# Patient Record
Sex: Female | Born: 1965 | Race: Black or African American | Hispanic: No | Marital: Married | Smoking: Former smoker
Health system: Southern US, Community
[De-identification: ages and names within clinical notes are randomized; demographics above are authoritative.]

## PROBLEM LIST (undated history)

## (undated) DIAGNOSIS — I10 Essential (primary) hypertension: Secondary | ICD-10-CM

## (undated) DIAGNOSIS — G43909 Migraine, unspecified, not intractable, without status migrainosus: Secondary | ICD-10-CM

## (undated) HISTORY — DX: Migraine, unspecified, not intractable, without status migrainosus: G43.909

## (undated) HISTORY — DX: Essential (primary) hypertension: I10

---

## 2008-09-23 ENCOUNTER — Telehealth (INDEPENDENT_AMBULATORY_CARE_PROVIDER_SITE_OTHER): Payer: Self-pay | Admitting: *Deleted

## 2008-09-23 ENCOUNTER — Encounter: Payer: Self-pay | Admitting: Family Medicine

## 2008-09-23 ENCOUNTER — Ambulatory Visit: Payer: Self-pay | Admitting: Family Medicine

## 2008-09-23 ENCOUNTER — Ambulatory Visit (HOSPITAL_COMMUNITY): Admission: RE | Admit: 2008-09-23 | Discharge: 2008-09-23 | Payer: Self-pay | Admitting: Family Medicine

## 2008-09-23 DIAGNOSIS — E669 Obesity, unspecified: Secondary | ICD-10-CM

## 2008-09-23 DIAGNOSIS — I10 Essential (primary) hypertension: Secondary | ICD-10-CM | POA: Insufficient documentation

## 2008-09-23 DIAGNOSIS — N920 Excessive and frequent menstruation with regular cycle: Secondary | ICD-10-CM

## 2008-09-23 DIAGNOSIS — I1A0 Resistant hypertension: Secondary | ICD-10-CM | POA: Insufficient documentation

## 2008-09-23 LAB — CONVERTED CEMR LAB
AST: 17 units/L (ref 0–37)
Albumin: 4.2 g/dL (ref 3.5–5.2)
BUN: 18 mg/dL (ref 6–23)
CO2: 23 meq/L (ref 19–32)
Calcium: 8.8 mg/dL (ref 8.4–10.5)
Chloride: 106 meq/L (ref 96–112)
Creatinine, Ser: 1.11 mg/dL (ref 0.40–1.20)
Direct LDL: 86 mg/dL
Glucose, Bld: 87 mg/dL (ref 70–99)
Potassium: 4.2 meq/L (ref 3.5–5.3)

## 2008-09-24 ENCOUNTER — Encounter: Payer: Self-pay | Admitting: Family Medicine

## 2008-09-25 ENCOUNTER — Encounter: Payer: Self-pay | Admitting: Family Medicine

## 2008-10-22 ENCOUNTER — Ambulatory Visit: Payer: Self-pay | Admitting: Family Medicine

## 2008-10-22 LAB — CONVERTED CEMR LAB
BUN: 16 mg/dL (ref 6–23)
Chloride: 106 meq/L (ref 96–112)
Potassium: 4 meq/L (ref 3.5–5.3)
Sodium: 138 meq/L (ref 135–145)

## 2008-10-26 ENCOUNTER — Telehealth: Payer: Self-pay | Admitting: *Deleted

## 2008-10-27 ENCOUNTER — Encounter: Payer: Self-pay | Admitting: Family Medicine

## 2009-03-23 ENCOUNTER — Emergency Department: Payer: Self-pay | Admitting: Emergency Medicine

## 2011-04-28 ENCOUNTER — Emergency Department: Payer: Self-pay | Admitting: Emergency Medicine

## 2011-04-28 LAB — CBC WITH DIFFERENTIAL/PLATELET
Basophil %: 0.2 %
Eosinophil %: 0.1 %
HCT: 37.1 % (ref 35.0–47.0)
HGB: 11.9 g/dL — ABNORMAL LOW (ref 12.0–16.0)
Lymphocyte %: 11 %
Monocyte %: 3.7 %
Neutrophil %: 85 %
RBC: 4.67 10*6/uL (ref 3.80–5.20)
WBC: 10.8 10*3/uL (ref 3.6–11.0)

## 2011-04-28 LAB — COMPREHENSIVE METABOLIC PANEL
BUN: 20 mg/dL — ABNORMAL HIGH (ref 7–18)
Calcium, Total: 8.9 mg/dL (ref 8.5–10.1)
Chloride: 104 mmol/L (ref 98–107)
Co2: 26 mmol/L (ref 21–32)
EGFR (African American): >60
EGFR (Non-African Amer.): 56 — ABNORMAL LOW
SGOT(AST): 20 U/L (ref 15–37)
SGPT (ALT): 17 U/L
Total Protein: 8.9 g/dL — ABNORMAL HIGH (ref 6.4–8.2)

## 2011-04-28 LAB — URINALYSIS, COMPLETE
Leukocyte Esterase: NEGATIVE
Nitrite: NEGATIVE
Ph: 8 (ref 4.5–8.0)
Specific Gravity: 1.008 (ref 1.003–1.030)
Squamous Epithelial: <1

## 2011-10-10 ENCOUNTER — Encounter: Payer: Self-pay | Admitting: Family Medicine

## 2011-10-10 ENCOUNTER — Ambulatory Visit (INDEPENDENT_AMBULATORY_CARE_PROVIDER_SITE_OTHER): Payer: 59 | Admitting: Family Medicine

## 2011-10-10 VITALS — BP 210/90 | Temp 98.6°F | Ht 65.0 in | Wt 221.9 lb

## 2011-10-10 DIAGNOSIS — I10 Essential (primary) hypertension: Secondary | ICD-10-CM

## 2011-10-10 LAB — BASIC METABOLIC PANEL
Chloride: 108 mEq/L (ref 96–112)
Glucose, Bld: 121 mg/dL — ABNORMAL HIGH (ref 70–99)
Potassium: 3.9 mEq/L (ref 3.5–5.3)
Sodium: 142 mEq/L (ref 135–145)

## 2011-10-10 MED ORDER — LISINOPRIL-HYDROCHLOROTHIAZIDE 20-12.5 MG PO TABS: 2.0000 | ORAL_TABLET | Freq: Every day | ORAL | Status: DC

## 2011-10-10 NOTE — Assessment & Plan Note (Signed)
Discussed the importance of taking blood pressure medications for now on. Dr. Leveda Anna also visited patient today - he expressed his concern about patient's a wide pulse pressure and long-standing, untreated hypertension. Will start lisinopril 20-HCTZ 12.5 milligrams daily today. Reviewed red flags of patient, signs of TIA or CVA that require prompt visit to emergency department. Patient patient states she will pick up blood pressure medication and start taking it today. She also agrees to schedule followup appointment with me next week. Will order BMET today.

## 2011-10-10 NOTE — Progress Notes (Signed)
  Subjective:    Anna Goodman is a 46 y.o. female who presents for evaluation of elevated blood pressures and to reestablish care.   Patient was last seen at [facility] about 2 years ago.  She used to take amlodipine and lisinopril/HCTZ for high blood pressure, however she has been out of medication for about 2 years now.  Patient denies any headache, blurry vision, fatigue, chest pain, shortness of breath. At times, she does endorse numbness/tingling sensation of bilateral upper extremities.  Patient denies any slurred speech, facial numbness, difficulty swallowing.    Otherwise, patient has no other complaints.  She wants to return for annual physical and Pap smear.  The following portions of the patient's history were reviewed and updated as appropriate: allergies, current medications, past family history, past medical history, past social history, past surgical history and problem list.  Review of Systems Pertinent items are noted in HPI.   Objective:    BP 210/90  Temp 98.6 F (37 C) (Oral)  Ht 5\' 5"  (1.651 m)  Wt 221 lb 14.4 oz (100.653 kg)  BMI 36.93 kg/m2  LMP 10/03/2011 General appearance: alert, cooperative and no distress Eyes: conjunctivae/corneas clear. PERRL, EOM's intact. Fundi benign. Lungs: clear to auscultation bilaterally Heart: regular rate and rhythm, S1, S2 normal, no murmur, click, rub or gallop Extremities: mild swelling bilateral feet, non-pitting, no redness or skin breakdown     Assessment:    Hypertension.  Today 210/90.  Plan:   See Problem List.

## 2011-10-10 NOTE — Patient Instructions (Signed)
It was nice to meet you today. Please pick up lisinopril-HCTZ at your pharmacy and start taking today. Try to avoid foods high in sodium and try to increase physical activity. If you develop severe headache, slurred speech, one-sided weakness, numbness or tingling of extremities, chest pain-please report to the ER. Schedule followup appointment with Dr. Domenick Bookbinder in one week.  Hypertension As your heart beats, it forces blood through your arteries. This force is your blood pressure. If the pressure is too high, it is called hypertension (HTN) or high blood pressure. HTN is dangerous because you may have it and not know it. High blood pressure may mean that your heart has to work harder to pump blood. Your arteries may be narrow or stiff. The extra work puts you at risk for heart disease, stroke, and other problems.  Blood pressure consists of two numbers, a higher number over a lower, 110/72, for example. It is stated as "110 over 72." The ideal is below 120 for the top number (systolic) and under 80 for the bottom (diastolic). Write down your blood pressure today. You should pay close attention to your blood pressure if you have certain conditions such as:  Heart failure.   Prior heart attack.   Diabetes   Chronic kidney disease.   Prior stroke.   Multiple risk factors for heart disease.  To see if you have HTN, your blood pressure should be measured while you are seated with your arm held at the level of the heart. It should be measured at least twice. A one-time elevated blood pressure reading (especially in the Emergency Department) does not mean that you need treatment. There may be conditions in which the blood pressure is different between your right and left arms. It is important to see your caregiver soon for a recheck. Most people have essential hypertension which means that there is not a specific cause. This type of high blood pressure may be lowered by changing lifestyle factors  such as:  Stress.   Smoking.   Lack of exercise.   Excessive weight.   Drug/tobacco/alcohol use.   Eating less salt.  Most people do not have symptoms from high blood pressure until it has caused damage to the body. Effective treatment can often prevent, delay or reduce that damage. TREATMENT  When a cause has been identified, treatment for high blood pressure is directed at the cause. There are a large number of medications to treat HTN. These fall into several categories, and your caregiver will help you select the medicines that are best for you. Medications may have side effects. You should review side effects with your caregiver. If your blood pressure stays high after you have made lifestyle changes or started on medicines,   Your medication(s) may need to be changed.   Other problems may need to be addressed.   Be certain you understand your prescriptions, and know how and when to take your medicine.   Be sure to follow up with your caregiver within the time frame advised (usually within two weeks) to have your blood pressure rechecked and to review your medications.   If you are taking more than one medicine to lower your blood pressure, make sure you know how and at what times they should be taken. Taking two medicines at the same time can result in blood pressure that is too low.  SEEK IMMEDIATE MEDICAL CARE IF:  You develop a severe headache, blurred or changing vision, or confusion.   You have  unusual weakness or numbness, or a faint feeling.   You have severe chest or abdominal pain, vomiting, or breathing problems.  MAKE SURE YOU:   Understand these instructions.   Will watch your condition.   Will get help right away if you are not doing well or get worse.  Document Released: 02/20/2005 Document Revised: 02/09/2011 Document Reviewed: 10/11/2007 [facility] Patient Information 2012 [place], [place].

## 2011-10-19 ENCOUNTER — Encounter: Payer: Self-pay | Admitting: Family Medicine

## 2011-10-19 ENCOUNTER — Other Ambulatory Visit (HOSPITAL_COMMUNITY)
Admission: RE | Admit: 2011-10-19 | Discharge: 2011-10-19 | Disposition: A | Discharge status: Home / Self Care | Payer: 59 | Source: Ambulatory Visit | Attending: Family Medicine | Admitting: Family Medicine

## 2011-10-19 ENCOUNTER — Ambulatory Visit (INDEPENDENT_AMBULATORY_CARE_PROVIDER_SITE_OTHER): Payer: 59 | Admitting: Family Medicine

## 2011-10-19 VITALS — BP 164/86 | Ht 65.0 in | Wt 223.0 lb

## 2011-10-19 DIAGNOSIS — Z124 Encounter for screening for malignant neoplasm of cervix: Secondary | ICD-10-CM

## 2011-10-19 DIAGNOSIS — I10 Essential (primary) hypertension: Secondary | ICD-10-CM

## 2011-10-19 DIAGNOSIS — Z01419 Encounter for gynecological examination (general) (routine) without abnormal findings: Secondary | ICD-10-CM | POA: Insufficient documentation

## 2011-10-19 DIAGNOSIS — L739 Follicular disorder, unspecified: Secondary | ICD-10-CM | POA: Insufficient documentation

## 2011-10-19 DIAGNOSIS — E669 Obesity, unspecified: Secondary | ICD-10-CM

## 2011-10-19 DIAGNOSIS — L738 Other specified follicular disorders: Secondary | ICD-10-CM

## 2011-10-19 MED ORDER — ADAPALENE 0.1 % EX LOTN: 1.0000 "application " | TOPICAL_LOTION | Freq: Every day | CUTANEOUS | Status: DC

## 2011-10-19 NOTE — Progress Notes (Signed)
  Subjective:     Anna Goodman is a 46 y.o. female and is here for a comprehensive physical exam. The patient is also here for follow up hypertension.  Patient was here one week ago, systolic blood pressure 210.  Patient restarted on blood pressure medications.  Denies any symptoms or side effects.  Patient is interested in weight loss.  She is asking for a medication to help suppress her appetite.  Patient exercises 2 times per week.  Typical diet and a day includes breakfast (boiled eggs, sausage, biscuit), lunch (fried chicken and bacteremia and cheese), dinner (hot dogs).  Patient says she drinks a lot of water and Gatorade, no soda.  Patient agrees that she needs to modify her lifestyle rather than take diet pills.  Patient here for Pap smear and referral for mammogram.  History   Social History  . Marital Status: Married    Spouse Name: N/A    Number of Children: N/A  . Years of Education: N/A   Occupational History  . Not on file.   Social History Main Topics  . Smoking status: Former Smoker    Types: Cigarettes  . Smokeless tobacco: Never Used  . Alcohol Use: Yes     drinks alcohol 2-4 times per month  . Drug Use: No  . Sexually Active:    Other Topics Concern  . Not on file   Social History Narrative  . No narrative on file   Health Maintenance  Topic Date Due  . Pap Smear  04/24/1983  . Tetanus/tdap  04/23/1984  . Influenza Vaccine  12/05/2011     Review of Systems Per history of present illness  Objective:    General appearance: alert, cooperative and no distress Head: Normocephalic, without obvious abnormality, atraumatic Throat: lips, mucosa, and tongue normal; teeth and gums normal Lungs: clear to auscultation bilaterally Breasts: normal appearance, no masses or tenderness Heart: regular rate and rhythm, S1, S2 normal, no murmur, click, rub or gallop Pelvic: cervix normal in appearance, external genitalia normal, no adnexal masses or tenderness, no  cervical motion tenderness, rectovaginal septum normal, uterus normal size, shape, and consistency and vagina normal without discharge Extremities: extremities normal, atraumatic, no cyanosis or edema  Skin: blackheads located on chin and bilateral inner thighs  Assessment:    Obesity, hypertension.     Plan:     See problem list. See After Visit Summary for Counseling Recommendations

## 2011-10-19 NOTE — Patient Instructions (Signed)
We will call or send letter with lab results in about one week. [facility] Call Dr. Gerilyn Pilgrim to schedule appointment to discuss Nutrition and weight loss. Schedule blood pressure follow up with me in 1-2 months. It was great to see you again today.

## 2011-10-19 NOTE — Assessment & Plan Note (Signed)
Differin gel lotion to decrease acne/folliculitis flare ups.

## 2011-10-19 NOTE — Assessment & Plan Note (Signed)
Blood pressure today much improved from last visit. Continue current regimen. Discussed lifestyle modification, weight loss, increase physical activity. Patient interested in meeting Dr. Gerilyn Pilgrim. Information given to schedule appointment. Recheck blood pressure in 1-2 months or sooner as needed.

## 2011-10-19 NOTE — Assessment & Plan Note (Signed)
See comments under essential hypertension.

## 2011-10-20 ENCOUNTER — Telehealth: Payer: Self-pay | Admitting: *Deleted

## 2011-10-20 NOTE — Telephone Encounter (Signed)
PA required for Differin lotion. Form placed in Dr. Bluford Kaufmann [place]'s box in Dr. Sherron Flemings Cruz's absence.

## 2011-10-23 NOTE — Telephone Encounter (Signed)
Approval received from insurance.  Pharmacy notified. 

## 2011-10-23 NOTE — Telephone Encounter (Signed)
Signed form and indicated for acne/recurrent folliculitis and placed in the "to be faxed" box

## 2011-10-25 ENCOUNTER — Encounter: Payer: Self-pay | Admitting: Family Medicine

## 2011-11-21 ENCOUNTER — Ambulatory Visit: Payer: 59 | Admitting: Family Medicine

## 2012-09-24 ENCOUNTER — Other Ambulatory Visit: Payer: Self-pay | Admitting: Family Medicine

## 2012-09-24 DIAGNOSIS — Z1231 Encounter for screening mammogram for malignant neoplasm of breast: Secondary | ICD-10-CM

## 2012-10-01 ENCOUNTER — Ambulatory Visit (HOSPITAL_COMMUNITY): Payer: 59

## 2012-10-03 ENCOUNTER — Ambulatory Visit (HOSPITAL_COMMUNITY)
Admission: RE | Admit: 2012-10-03 | Discharge: 2012-10-03 | Disposition: A | Discharge status: Home / Self Care | Payer: 59 | Source: Ambulatory Visit | Attending: Family Medicine | Admitting: Family Medicine

## 2012-10-03 DIAGNOSIS — Z1231 Encounter for screening mammogram for malignant neoplasm of breast: Secondary | ICD-10-CM | POA: Insufficient documentation

## 2012-10-08 ENCOUNTER — Emergency Department: Payer: Self-pay | Admitting: Emergency Medicine

## 2012-10-08 LAB — BASIC METABOLIC PANEL
Anion Gap: 6 — ABNORMAL LOW (ref 7–16)
BUN: 19 mg/dL — ABNORMAL HIGH (ref 7–18)
Co2: 26 mmol/L (ref 21–32)
Creatinine: 0.9 mg/dL (ref 0.60–1.30)
EGFR (Non-African Amer.): >60
Glucose: 89 mg/dL (ref 65–99)

## 2012-10-08 LAB — URINALYSIS, COMPLETE
Bilirubin,UR: NEGATIVE
Glucose,UR: NEGATIVE mg/dL (ref 0–75)
Nitrite: NEGATIVE
Ph: 5 (ref 4.5–8.0)
RBC,UR: 2 /HPF (ref 0–5)
Specific Gravity: 1.017 (ref 1.003–1.030)
Squamous Epithelial: 3
WBC UR: 160 /HPF (ref 0–5)

## 2012-10-08 LAB — CBC
MCH: 25.5 pg — ABNORMAL LOW (ref 26.0–34.0)
MCHC: 32.6 g/dL (ref 32.0–36.0)
RDW: 15.6 % — ABNORMAL HIGH (ref 11.5–14.5)
WBC: 8.8 10*3/uL (ref 3.6–11.0)

## 2013-01-02 ENCOUNTER — Telehealth: Payer: Self-pay | Admitting: Family Medicine

## 2013-01-02 MED ORDER — LISINOPRIL-HYDROCHLOROTHIAZIDE 20-12.5 MG PO TABS: 2.0000 | ORAL_TABLET | Freq: Every day | ORAL | Status: DC

## 2013-01-02 NOTE — Telephone Encounter (Signed)
Will fwd. To PCP for refill. Thanks. Lorenda Hatchet, Renato Battles

## 2013-01-02 NOTE — Telephone Encounter (Signed)
LMVM that refill sent.  Anivea Velasques, Darlyne Russian, CMA

## 2013-01-02 NOTE — Telephone Encounter (Signed)
Pt called and would like enough of her linsinopril sent to her pharmacy until her appointment which is 11/10. She is completely out. JW

## 2013-01-13 ENCOUNTER — Ambulatory Visit: Payer: 59 | Admitting: Family Medicine

## 2014-05-12 IMAGING — MG MM DIGITAL SCREENING BILAT W/ CAD
4 series · 4 of 4 positions shown · non-contrast
Comparison: None.

CLINICAL DATA: Screening.

DIGITAL SCREENING BILATERAL MAMMOGRAM WITH CAD

[R CC]
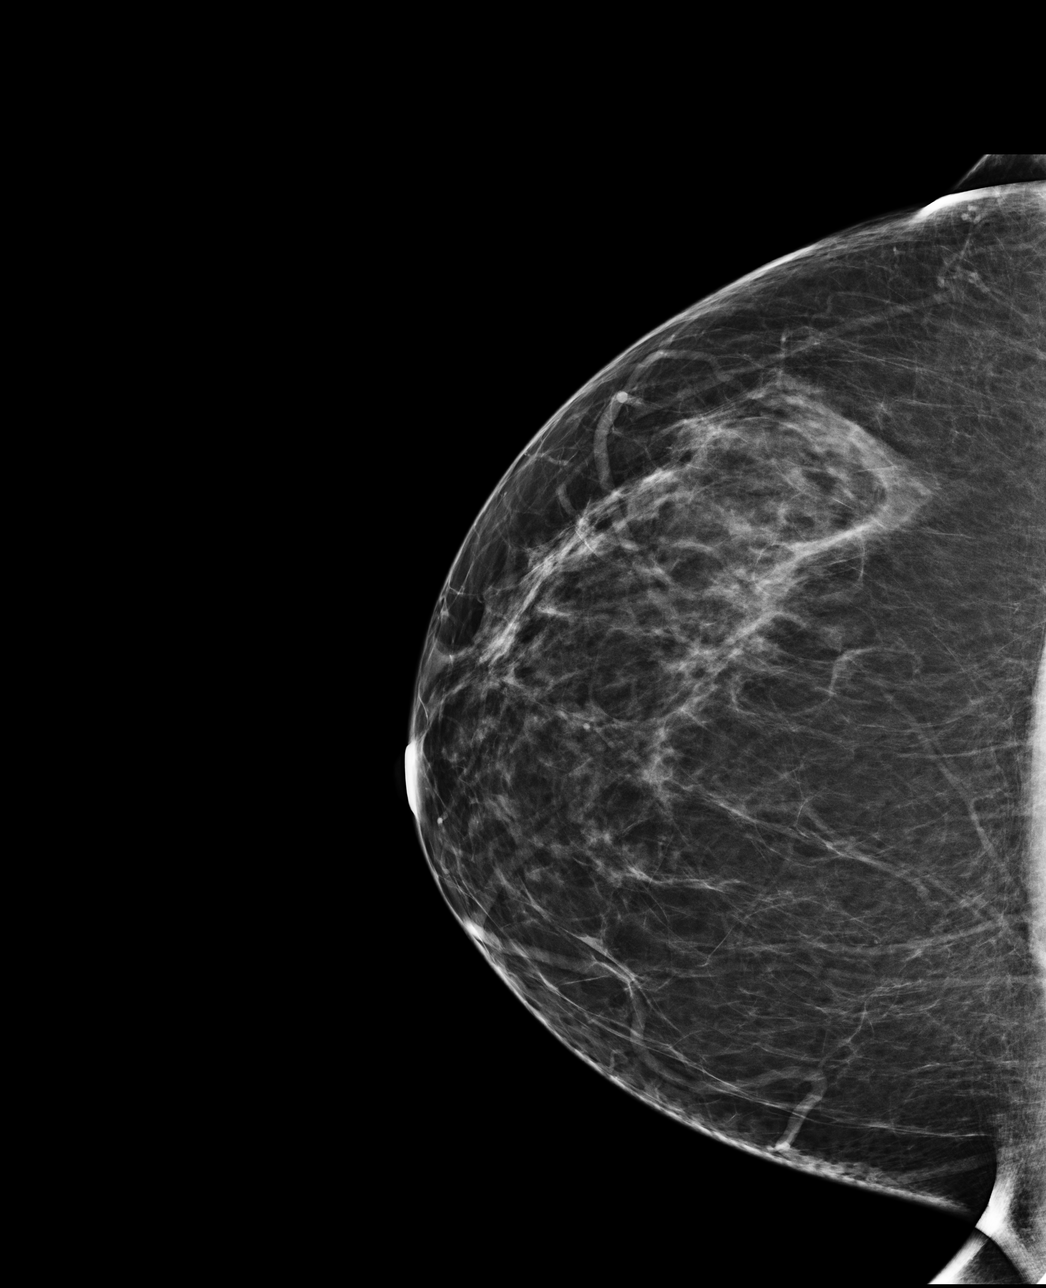

[L MLO]
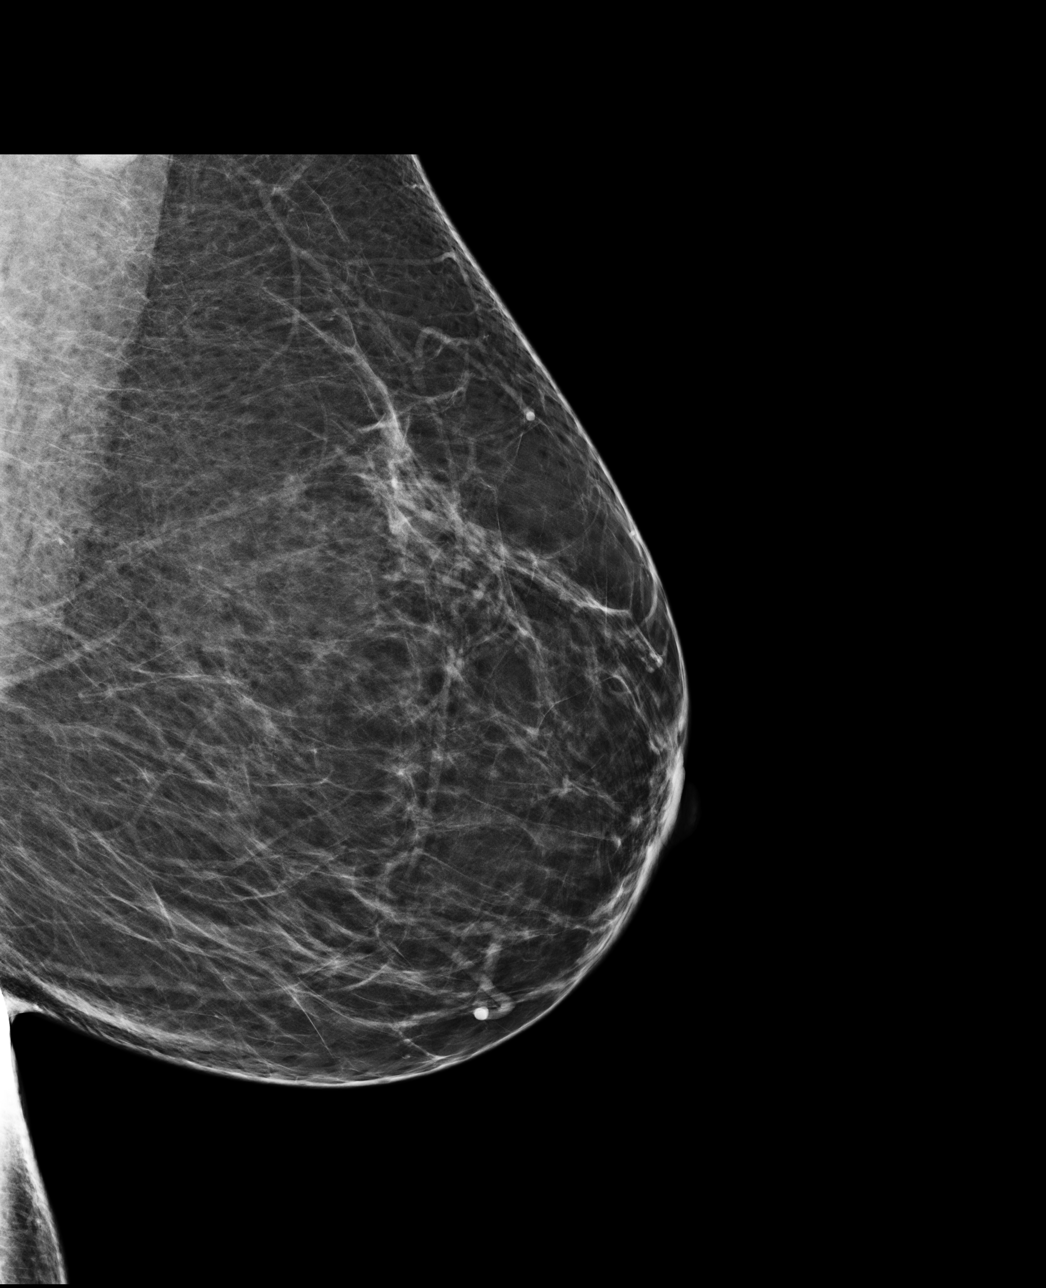

[L CC]
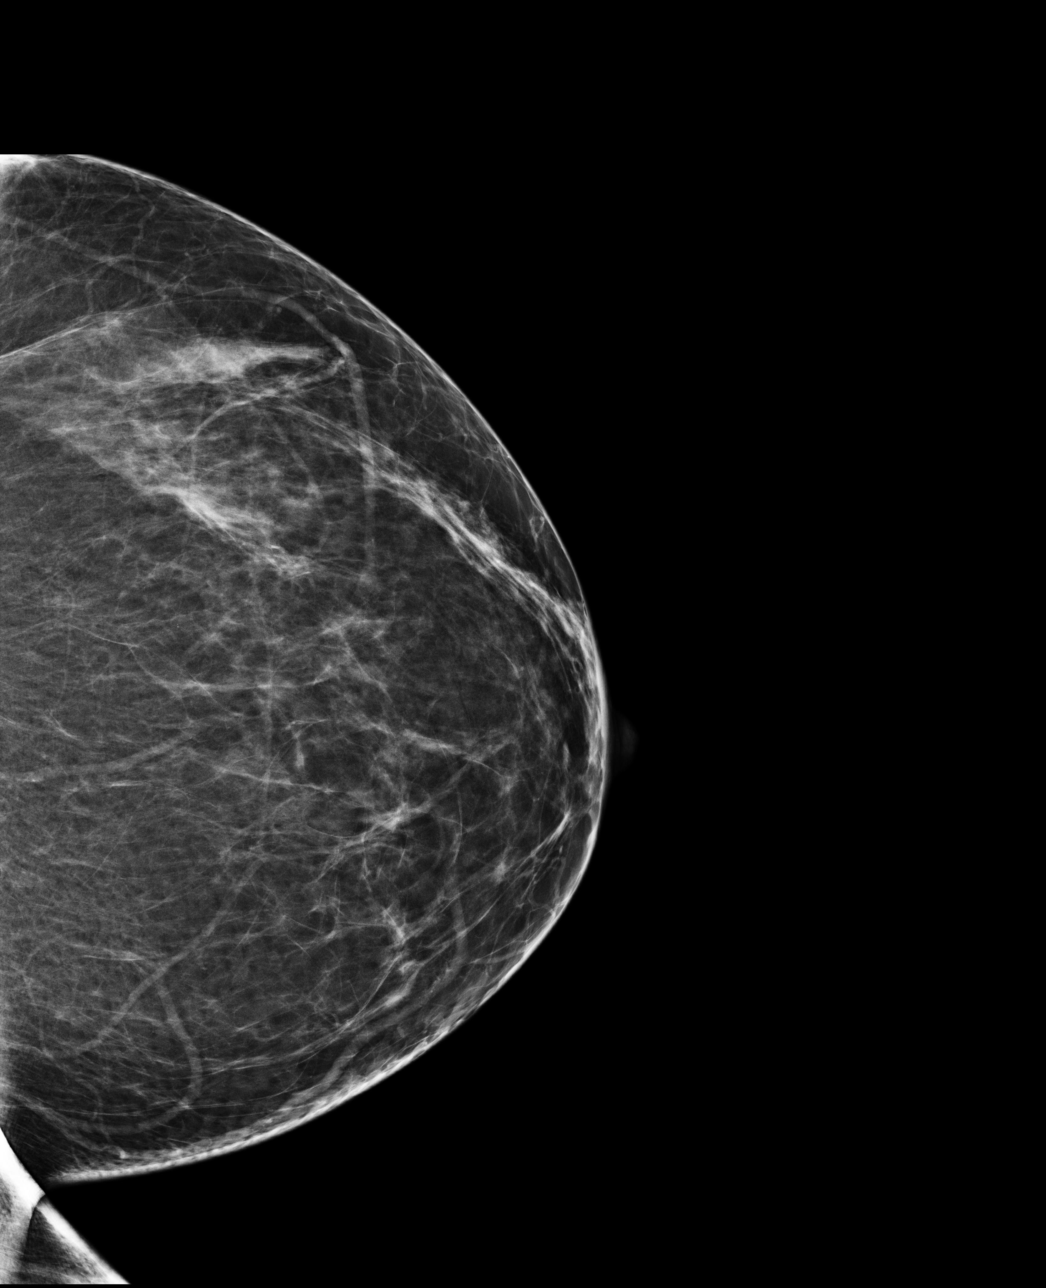

[R MLO]
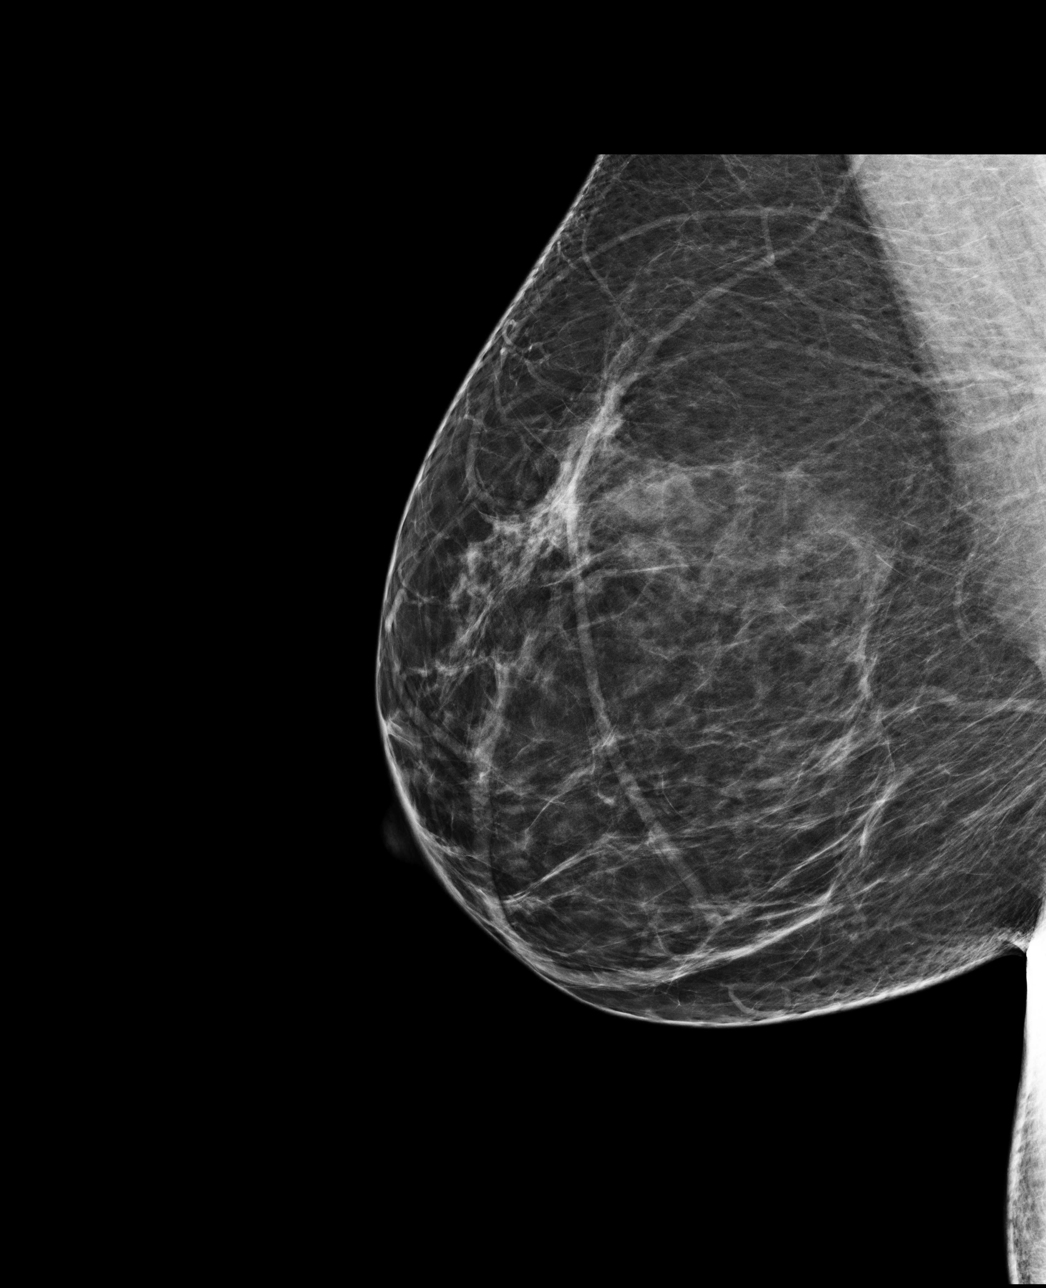

[4 of 4 positions shown; findings below may reference images not displayed]

FINDINGS: ACR Breast Density Category b:  There are scattered areas of
fibroglandular density.

There are no findings suspicious for malignancy.

Images were processed with CAD.
IMPRESSION: No mammographic evidence of malignancy.

A result letter of this screening mammogram will be mailed directly
to the patient.

RECOMMENDATION:
Screening mammogram in one year. (Code:WE-K-JAQ)

BI-RADS CATEGORY 1:  Negative.

## 2016-04-12 LAB — HM HIV SCREENING LAB: HM HIV Screening: NEGATIVE

## 2019-05-22 LAB — HM MAMMOGRAPHY

## 2021-05-10 ENCOUNTER — Encounter: Payer: Self-pay | Admitting: Family Medicine

## 2021-05-10 ENCOUNTER — Other Ambulatory Visit: Payer: Self-pay

## 2021-05-10 ENCOUNTER — Ambulatory Visit (INDEPENDENT_AMBULATORY_CARE_PROVIDER_SITE_OTHER): Payer: 59 | Admitting: Family Medicine

## 2021-05-10 VITALS — BP 186/110 | HR 83 | Ht 65.0 in | Wt 236.4 lb

## 2021-05-10 DIAGNOSIS — Z7689 Persons encountering health services in other specified circumstances: Secondary | ICD-10-CM

## 2021-05-10 DIAGNOSIS — G4733 Obstructive sleep apnea (adult) (pediatric): Secondary | ICD-10-CM | POA: Insufficient documentation

## 2021-05-10 DIAGNOSIS — I10 Essential (primary) hypertension: Secondary | ICD-10-CM

## 2021-05-10 DIAGNOSIS — E782 Mixed hyperlipidemia: Secondary | ICD-10-CM | POA: Diagnosis not present

## 2021-05-10 DIAGNOSIS — R29818 Other symptoms and signs involving the nervous system: Secondary | ICD-10-CM | POA: Diagnosis not present

## 2021-05-10 MED ORDER — ATORVASTATIN CALCIUM 10 MG PO TABS: 10.0000 mg | ORAL_TABLET | Freq: Every day | ORAL | 1 refills | Status: DC

## 2021-05-10 MED ORDER — AMLODIPINE-VALSARTAN-HCTZ 10-320-25 MG PO TABS: 1.0000 | ORAL_TABLET | Freq: Every day | ORAL | 1 refills | Status: DC

## 2021-05-10 NOTE — Assessment & Plan Note (Addendum)
Severely uncontrolled HTN initial, manual repeat improved but still elevated. She has known history of extremely elevated BP and is asymptomatic. ?Out of medications currently as main explanation ?Suspect underlying secondary HTN factors, Obesity, fam history ?- Home BP readings recomm  ? No known complications  ?  ? ?Plan:  ?1. DC Lisinopril 40 ?2. START combo pill triple therapy - Amlodipine 10mg  + Valsartan 320mg  daily + HCTZ 25mg  daily combo pill x 3 - patient prefers single pill. ?3. Encourage improved lifestyle - low sodium diet, regular exercise ?4. Continue monitor BP outside office, bring readings to next visit, if persistently >140/90 or new symptoms notify office sooner ? ?Next plan for OSA diagnosis and treatment first line for work up. ?If still uncontrolled, and unable to manage we can refer to Dr [facility] [place] for Adv HTN Clinic. ?

## 2021-05-10 NOTE — Progress Notes (Signed)
? ?Subjective:  ? ? Patient ID: Anna Goodman, female    DOB: 05-29-1965, 56 y.o.   MRN: [id number] ? ?Anna Goodman is a 56 y.o. female presenting on 05/10/2021 for Establish Care ? ?Establish care with PCP. [facility]. She is living in [place] and has relocated job in [employer]. ? ?HPI ? ?CHRONIC HTN: ?Reports history of HTN for years. Some difficulty with managing. She has been advised that weight is contributing. ? ?Currently off medication due to changing doctors and relocating, lately has had 180-220 / 100-125 while off medication ?However while on medication 140-180 / 80-90 ? ?Current Meds - Amlodipine 10mg  daily, Lisinopril-HCTZ 20-12.5mg  x 2 = 40-25mg  daily   ?Reports good compliance, DID NOT take meds today ?Lifestyle: ?- Diet: no sodium added. Limiting diet in take sodium. No coffee. Drinks tea occasional. Drinks mostly water. ?- Exercise: Limited activity ?- Admits some anxiety stress w/ husband's medical history ?Denies CP, dyspnea, HA, edema, dizziness / lightheadedness ? ?Obesity BMI >39 ? ?Suspected Sleep Apnea ? ?Epworth Sleepiness Scale ?Total Score: 14 ?Sitting and reading - 1 ?Watching TV - 3 ?Sitting inactive in a public place - 2 ?As a passenger in a car for an hour without a break - 3 ?Lying down to rest in the afternoon when circumstances permit - 3 ?Sitting and talking to someone - 0 ?Sitting quietly after a lunch without alcohol - 1 ?In a car, while stopped for a few minutes in traffic - 1 ? ?STOP-Bang OSA scoring ?Snoring yes   ?Tiredness yes   ?Observed apneas no   ?Pressure HTN yes   ?BMI > 35 kg/m2 yes   ?Age > 57  yes   ?Neck (female >17 in; Female >16 in)  yes   ?Gender female no   ?OSA risk low (0-2)  ?OSA risk intermediate (3-4)  ?OSA risk high (5+)  Total: 6 High risk  ? ? ? ?Depression screen [facility] 2/9 05/10/2021 10/19/2011  ?Decreased Interest 0 0  ?Down, Depressed, Hopeless 0 0  ?PHQ - 2 Score 0 0  ?Altered sleeping 0 -  ?Tired, decreased energy 0 -  ?Change in appetite 0 -   ?Feeling bad or failure about yourself  0 -  ?Trouble concentrating 0 -  ?Moving slowly or fidgety/restless 0 -  ?Suicidal thoughts 0 -  ?PHQ-9 Score 0 -  ?Difficult doing work/chores Not difficult at all -  ? ? ?Past Medical History:  ?Diagnosis Date  ? Hypertension   ? Migraine   ? ?Past Surgical History:  ?Procedure Laterality Date  ? [place]  ? [place]  ? ?Social History  ? ?Socioeconomic History  ? Marital status: Married  ?  Spouse name: Not on file  ? Number of children: Not on file  ? Years of education: Not on file  ? Highest education level: Not on file  ?Occupational History  ? Not on file  ?Tobacco Use  ? Smoking status: Former  ?  Types: Cigarettes  ? Smokeless tobacco: Never  ?Substance and Sexual Activity  ? Alcohol use: Yes  ?  Comment: drinks alcohol 2-4 times per month  ? Drug use: No  ? Sexual activity: Not on file  ?Other Topics Concern  ? Not on file  ?Social History Narrative  ? Not on file  ? ?Social Determinants of Health  ? ?Financial Resource Strain: Not on file  ?Food Insecurity: Not on file  ?Transportation Needs: Not on file  ?Physical  Activity: Not on file  ?Stress: Not on file  ?Social Connections: Not on file  ?Intimate Partner Violence: Not on file  ? ?Family History  ?Problem Relation Age of Onset  ? Cancer Mother   ? Depression Mother   ? Hypertension Mother   ? Hypertension Father   ? Hypertension Sister   ? Asthma Brother   ? ?Current Outpatient Medications on File Prior to Visit  ?Medication Sig  ? aspirin 81 MG EC tablet Take 1 tablet by mouth daily.  ? ?No current facility-administered medications on file prior to visit.  ? ? ?Review of Systems ?Per HPI unless specifically indicated above ? ? ?   ?Objective:  ?  ?BP (!) 186/110 (BP Location: Left Arm, Cuff Size: Normal)   Pulse 83   Ht 5\' 5"  (1.651 m)   Wt 236 lb 6.4 oz (107.2 kg)   SpO2 100%   BMI 39.34 kg/m?   ?Wt Readings from Last 3 Encounters:  ?05/10/21 236 lb 6.4 oz (107.2 kg)   ?10/19/11 223 lb (101.2 kg)  ?10/10/11 221 lb 14.4 oz (100.7 kg)  ?  ?Physical Exam ?Vitals and nursing note reviewed.  ?Constitutional:   ?   General: She is not in acute distress. ?   Appearance: Normal appearance. She is well-developed. She is obese. She is not diaphoretic.  ?   Comments: Well-appearing, comfortable, cooperative  ?HENT:  ?   Head: Normocephalic and atraumatic.  ?Eyes:  ?   General:     ?   Right eye: No discharge.     ?   Left eye: No discharge.  ?   Conjunctiva/sclera: Conjunctivae normal.  ?Cardiovascular:  ?   Rate and Rhythm: Normal rate.  ?Pulmonary:  ?   Effort: Pulmonary effort is normal.  ?Skin: ?   General: Skin is warm and dry.  ?   Findings: No erythema or rash.  ?Neurological:  ?   Mental Status: She is alert and oriented to person, place, and time.  ?Psychiatric:     ?   Mood and Affect: Mood normal.     ?   Behavior: Behavior normal.     ?   Thought Content: Thought content normal.  ?   Comments: Well groomed, good eye contact, normal speech and thoughts  ? ? ? ?Results for orders placed or performed in visit on 10/10/11  ?Basic Metabolic Panel  ?Result Value Ref Range  ? Sodium 142 135 - 145 mEq/L  ? Potassium 3.9 3.5 - 5.3 mEq/L  ? Chloride 108 96 - 112 mEq/L  ? CO2 24 19 - 32 mEq/L  ? Glucose, Bld 121 (H) 70 - 99 mg/dL  ? BUN 18 6 - 23 mg/dL  ? Creat 1.01 0.50 - 1.10 mg/dL  ? Calcium 9.2 8.4 - 10.5 mg/dL  ? ?   ?Assessment & Plan:  ? ?Problem List Items Addressed This Visit   ? ? Essential hypertension - Primary  ?  Severely uncontrolled HTN initial, manual repeat improved but still elevated. She has known history of extremely elevated BP and is asymptomatic. ?Out of medications currently as main explanation ?Suspect underlying secondary HTN factors, Obesity, fam history ?- Home BP readings recomm  ? No known complications  ?  ? ?Plan:  ?1. DC Lisinopril 40 ?2. START combo pill triple therapy - Amlodipine 10mg  + Valsartan 320mg  daily + HCTZ 25mg  daily combo pill x 3 - patient  prefers single pill. ?3. Encourage improved lifestyle - low sodium  diet, regular exercise ?4. Continue monitor BP outside office, bring readings to next visit, if persistently >140/90 or new symptoms notify office sooner ? ?Next plan for OSA diagnosis and treatment first line for work up. ?If still uncontrolled, and unable to manage we can refer to Dr Oval Linsey [facility] [place] for Adv HTN Clinic. ?  ?  ? Relevant Medications  ? aspirin 81 MG EC tablet  ? amLODIPine-Valsartan-HCTZ 10-320-25 MG TABS  ? atorvastatin (LIPITOR) 10 MG tablet  ? ?Other Visit Diagnoses   ? ? Encounter to establish care with new doctor      ? Mixed hyperlipidemia      ? Relevant Medications  ? aspirin 81 MG EC tablet  ? amLODIPine-Valsartan-HCTZ 10-320-25 MG TABS  ? atorvastatin (LIPITOR) 10 MG tablet  ? Suspected sleep apnea      ? ?  ?  ?Morbid Obesity BMI >39 ?Discussion today on weight management ?She will check into cost coverage w insurance on GLP1 and other options ?Encourage lifestyle management ?Future consider med manage or referral ? ?New problem with clinical concern for suspected obstructive sleep apnea given reported symptoms with apnea, snoring and sleep disturbance, excessive sleepiness. ?- Screening: ESS score 14 / STOP-Bang Score 6 ?- Neck Circumference: 16.5" ?- Co-morbidities: HTN ? ?Plan: ?1. Discussion on initial diagnosis and testing for OSA, risk factors, management, complications ?2. Agree to proceed with sleep study testing based on clinical concerns - will fax referral to [facility] - to arrange initial PSG home vs in sleep lab and further evaluation. ? ? ?Meds ordered this encounter  ?Medications  ? amLODIPine-Valsartan-HCTZ 10-320-25 MG TABS  ?  Sig: Take 1 tablet by mouth daily.  ?  Dispense:  90 tablet  ?  Refill:  1  ? atorvastatin (LIPITOR) 10 MG tablet  ?  Sig: Take 1 tablet (10 mg total) by mouth daily.  ?  Dispense:  90 tablet  ?  Refill:  1  ? ? ? ? ?Follow up plan: ?Return in about 3  weeks (around 05/31/2021) for 3 weeks fasting lab only then 1 week later Annual Physical. ? ?Nobie Putnam, DO ?[facility] ?[facility] ?05/10/2021, 3:31 PM ?

## 2021-05-10 NOTE — Patient Instructions (Addendum)
Thank you for coming to the office today. ? ?New blood pressure pill - has all 3 meds in 1 pill - new one instead of Lisinopril = Valsartan 320mg  ? ?[facility] ?Address: [address] [place], [place], [place] ?Phone: [phone] ? ?Referral sent via fax today. Stay tuned to hear back from them, they should call you to schedule initial sleep study, likely a home test and then if abnormal - they will arrange the 2nd part of the sleep study in the lab with the CPAP machine. ? ?If there is any issue with this company, for example not covered by insurance or other problem, please notify [phone] and they may do so a well - we will need to change the location of the referral. ? ? ?Sleep Hygiene ?Recommendations to promote healthy sleep in all patients, especially if symptoms of insomnia are worsening. Due to the nature of sleep rhythms, if your body gets "out of rhythm", it may take some time before your sleep cycle can be "reset". ? ?Please try to follow as many of the following tips as you can, usually there are only a few of these are the primary cause of the problem. ? ??To reset your sleep rhythm, go to bed and get up at the same time every day ??Sleep only long enough to feel rested and then get out of bed ??Do not try to force yourself to sleep. If you can't sleep, get out of bed and try again later. ??Avoid naps during the day, unless excessively tired. The more sleeping during the day, then the less sleep your body needs at night. ? ??Have coffee, tea, and other foods that have caffeine only in the morning ??Exercise several days a week, but not right before bed ??If you drink alcohol, prefer to have appropriate drink with one meal, but prefer to avoid alcohol in the evening, and bedtime ??If you smoke, avoid smoking, especially in the evening ? ??Avoid watching TV or looking at phones, computers, or reading devices ("e-books") that give off light at least 30 minutes before bed. This  artificial light sends "awake signals" to your brain and can make it harder to fall asleep. ??Make your bedroom a comfortable place where it is easy to fall asleep: ?Put up shades or special blackout curtains to block light from outside. ?Use a white noise machine to block noise. ?Keep the temperature cool. ??Try your best to solve or at least address your problems before you go to bed ??Use relaxation techniques to manage stress. Ask your health care provider to suggest some techniques that may work well for you. These may include: ?Breathing exercises. ?Routines to release muscle tension. ?Visualizing peaceful scenes. ? ?--------------------------------------- ? ?Call insurance find cost and coverage of the following - check the following: ?- Drug Tier, Preferred List, On Formulary ?- All will require a "Prior Authorization" from [place] first, before you can find out the cost ?- Find out if there is "Step Therapy" (other medicines required before you can try these) ? ?Once you pick the one you want to try, let me know - we can get a sample ready IF we have it in stock. Then try it - and before running out of medicine, contact me back to order your Rx so we have time to get it processed. ? ?For Pre-Diabetes / Diabetes ?  ?1. Ozempic (Semaglutide injection) - start 0.25mg  weekly for 4 weeks then increase to 0.5mg  weekly, sample is 6 doses, re-use  the same pen until empty, new needle each dose. ?  ?2. Trulicity (Dulaglutide) - once weekly 0.75 (likely we would start) and 1.5 max dose, sample is 2 doses, 1 dose per pen, each pen is a one time use, no visible needle, it is an auto-injector. ? ?---------- ? ?For Weight Loss / Obesity only ? ?Wegovy (same as Ozempic) weekly injection - start 0.25mg  weekly, 1 dose per pen, single use, auto-injector ? ?2. Saxenda - DAILY injection - start 0.6mg  injection DAILY, sample is 3 weeks, new needle each dose. ? ? ?3 benefits ?- 1 significantly reduced A1c sugar, and may be able to  reduce or stop metformin in future ?- 2 reduced appetite and weight loss with good results ?- 3 cardiovascular risk reduction, less likely to have heart attack/stroke ? ? ?--------------------------------------------- ? ? ?DUE for FASTING BLOOD WORK (no food or drink after midnight before the lab appointment, only water or coffee without cream/sugar on the morning of) ? ?SCHEDULE "Lab Only" visit in the morning at the clinic for lab draw in 4 weeks ? ?- Make sure Lab Only appointment is at about 1 week before your next appointment, so that results will be available ? ?For Lab Results, once available within 2-3 days of blood draw, you can can log in to MyChart online to view your results and a brief explanation. Also, we can discuss results at next follow-up visit. ? ? ?Please schedule a Follow-up Appointment to: Return in about 3 weeks (around 05/31/2021) for 3 weeks fasting lab only then 1 week later Annual Physical. ? ?If you have any other questions or concerns, please feel free to call the office or send a message through MyChart. You may also schedule an earlier appointment if necessary. ? ?Additionally, you may be receiving a survey about your experience at our office within a few days to 1 week by e-mail or mail. We value your feedback. ? ?Saralyn Pilar, DO ?[facility], [place] ?

## 2021-06-01 ENCOUNTER — Telehealth: Payer: Self-pay | Admitting: Pharmacist

## 2021-06-01 ENCOUNTER — Encounter: Payer: Self-pay | Admitting: Family Medicine

## 2021-06-01 NOTE — Telephone Encounter (Signed)
? ? ?  Patient appearing on report for [place] Metric Hypertension Control due to last documented ambulatory blood pressure of 186/110 on 05/10/2021. Next appointment with PCP is 06/10/2021  ? ?Outreached patient to discuss hypertension control and medication management. Was unable to reach patient and unable to leave a message as voicemail box is full. ? ?Will attempt to reach patient by telephone again within the next 30 days. ? ?Wallace Cullens, PharmD, BCACP, CPP ?Clinical Pharmacist ?[facility] ?[place] ?[phone] ? ? ?

## 2021-06-01 NOTE — Telephone Encounter (Signed)
? ?  Outreach Note ? ?06/01/2021 ?Name: Anna Goodman MRN: [id number] DOB: December 31, 1965 ? ?Patient appearing on report for [place] Metric Hypertension Control due to last documented ambulatory pressure of 186/110 on 05/10/2021. Next appointment with PCP is 06/10/2021 (lab work on 06/03/2021). ? ?Outreached patient to discuss hypertension control and medication management.  ? ?Outpatient Encounter Medications as of 06/01/2021  ?Medication Sig  ? amLODIPine-Valsartan-HCTZ 10-320-25 MG TABS Take 1 tablet by mouth daily.  ? aspirin 81 MG EC tablet Take 1 tablet by mouth daily.  ? atorvastatin (LIPITOR) 10 MG tablet Take 1 tablet (10 mg total) by mouth daily.  ? ?No facility-administered encounter medications on file as of 06/01/2021.  ? ? ?Current medications: amlodipine-valsartan-HCTZ 10-320-25 mg daily ?Reports missed once over past 2 weeks ? ?Patient does not have an automated upper arm home BP machine. ?Patient currently has wrist monitor. She does not have her readings with her at the time of our call, but recalls recent systolic readings ranging 180s-200, even since taking amlodipine-valsartan-HCTZ 10-320-25 mg daily ? ? ?Patient denies recent hypertensive symptoms including chest pain, shortness of breath, back pain, numbness/weakness, change in vision, or difficulty speaking with recent readings ? ?Current physical activity: reports limited recently ? ?Does not add salt to her food, but admits to recently eating higher sodium food recently as refrigerator currently broken (eating out more) ? ? ?Assessment/Plan: ?Currently significantly uncontrolled ?Reviewed appropriate administration of medication regimen ?Counseled on long term microvascular and macrovascular complications of uncontrolled hypertension ?Discussed dietary modifications, such as reduced salt intake, focus on whole grains, vegetables, lean proteins ?Discussed goal of 150 minutes of moderate intensity physical activity weekly - Collaborated with primary care  provider office staff to ensure patient has scheduled follow up ?Reviewed appropriate home BP monitoring technique (avoid caffeine, smoking, and exercise for 30 minutes before checking, rest for at least 5 minutes before taking BP, sit with feet flat on the floor and back against a hard surface, uncross legs, and rest arm on flat surface) ?Will email blood pressure log and handout on monitoring technique to patient ?Patient will obtain home upper arm blood pressure monitor ?Reviewed to check blood pressure, document, and provide at next provider visit ?Advised patient if blood pressure reading is 180/120 or greater and experiencing any symptoms of headache, chest pain, shortness of breath, back pain, numbness/weakness, change in vision, or difficulty speaking to seek emergency care ?Will collaborate with PCP today regarding report of significantly elevated home blood pressure ? ?Follow Up Plan: CM Pharmacist will follow up with patient by telephone within the next 14 days ? ?Estelle Grumbles, PharmD, BCACP ?Clinical Pharmacist ?[facility] ?[phone] ? ? ? ? ?SIG ? ?

## 2021-06-01 NOTE — Telephone Encounter (Addendum)
Pt returning call. ?Pt requesting a call back.  ? ?[phone]  ?

## 2021-06-02 ENCOUNTER — Other Ambulatory Visit: Payer: Self-pay

## 2021-06-02 DIAGNOSIS — Z0000 Encounter for general adult medical examination without abnormal findings: Secondary | ICD-10-CM

## 2021-06-03 ENCOUNTER — Other Ambulatory Visit: Payer: 59

## 2021-06-04 LAB — COMPREHENSIVE METABOLIC PANEL
AG Ratio: 1.4 (calc) (ref 1.0–2.5)
ALT: 13 U/L (ref 6–29)
AST: 15 U/L (ref 10–35)
Albumin: 4.3 g/dL (ref 3.6–5.1)
Alkaline phosphatase (APISO): 34 U/L — ABNORMAL LOW (ref 37–153)
BUN/Creatinine Ratio: 25 (calc) — ABNORMAL HIGH (ref 6–22)
BUN: 30 mg/dL — ABNORMAL HIGH (ref 7–25)
CO2: 26 mmol/L (ref 20–32)
Calcium: 9.8 mg/dL (ref 8.6–10.4)
Chloride: 102 mmol/L (ref 98–110)
Creat: 1.2 mg/dL — ABNORMAL HIGH (ref 0.50–1.03)
Globulin: 3.1 g/dL (calc) (ref 1.9–3.7)
Glucose, Bld: 95 mg/dL (ref 65–99)
Potassium: 4.3 mmol/L (ref 3.5–5.3)
Sodium: 139 mmol/L (ref 135–146)
Total Bilirubin: 0.4 mg/dL (ref 0.2–1.2)
Total Protein: 7.4 g/dL (ref 6.1–8.1)

## 2021-06-04 LAB — CBC WITH DIFFERENTIAL/PLATELET
Absolute Monocytes: 320 cells/uL (ref 200–950)
Basophils Absolute: 19 cells/uL (ref 0–200)
Basophils Relative: 0.4 %
Eosinophils Absolute: 42 cells/uL (ref 15–500)
Eosinophils Relative: 0.9 %
HCT: 44.2 % (ref 35.0–45.0)
Hemoglobin: 13.8 g/dL (ref 11.7–15.5)
Lymphs Abs: 1575 cells/uL (ref 850–3900)
MCH: 27 pg (ref 27.0–33.0)
MCHC: 31.2 g/dL — ABNORMAL LOW (ref 32.0–36.0)
MCV: 86.3 fL (ref 80.0–100.0)
MPV: 13.3 fL — ABNORMAL HIGH (ref 7.5–12.5)
Monocytes Relative: 6.8 %
Neutro Abs: 2745 cells/uL (ref 1500–7800)
Neutrophils Relative %: 58.4 %
Platelets: 181 10*3/uL (ref 140–400)
RBC: 5.12 10*6/uL — ABNORMAL HIGH (ref 3.80–5.10)
RDW: 13.6 % (ref 11.0–15.0)
Total Lymphocyte: 33.5 %
WBC: 4.7 10*3/uL (ref 3.8–10.8)

## 2021-06-04 LAB — TSH: TSH: 4.57 mIU/L — ABNORMAL HIGH (ref 0.40–4.50)

## 2021-06-04 LAB — LIPID PANEL
Cholesterol: 251 mg/dL — ABNORMAL HIGH (ref <200)
HDL: 65 mg/dL (ref >=50)
LDL Cholesterol (Calc): 149 mg/dL (calc) — ABNORMAL HIGH
Non-HDL Cholesterol (Calc): 186 mg/dL (calc) — ABNORMAL HIGH (ref <130)
Total CHOL/HDL Ratio: 3.9 (calc) (ref <5.0)
Triglycerides: 229 mg/dL — ABNORMAL HIGH (ref <150)

## 2021-06-10 ENCOUNTER — Encounter: Payer: Self-pay | Admitting: Family Medicine

## 2021-06-10 ENCOUNTER — Ambulatory Visit (INDEPENDENT_AMBULATORY_CARE_PROVIDER_SITE_OTHER): Payer: 59 | Admitting: Family Medicine

## 2021-06-10 VITALS — BP 156/98 | HR 69 | Ht 65.0 in | Wt 236.2 lb

## 2021-06-10 DIAGNOSIS — Z1231 Encounter for screening mammogram for malignant neoplasm of breast: Secondary | ICD-10-CM

## 2021-06-10 DIAGNOSIS — Z124 Encounter for screening for malignant neoplasm of cervix: Secondary | ICD-10-CM

## 2021-06-10 DIAGNOSIS — G4733 Obstructive sleep apnea (adult) (pediatric): Secondary | ICD-10-CM | POA: Diagnosis not present

## 2021-06-10 DIAGNOSIS — Z0000 Encounter for general adult medical examination without abnormal findings: Secondary | ICD-10-CM

## 2021-06-10 DIAGNOSIS — I1A0 Resistant hypertension: Secondary | ICD-10-CM

## 2021-06-10 DIAGNOSIS — I10 Essential (primary) hypertension: Secondary | ICD-10-CM

## 2021-06-10 NOTE — Progress Notes (Signed)
? ?Subjective:  ? ? Patient ID: Anna Goodman, female    DOB: 1965/05/17, 56 y.o.   MRN: [id number] ? ?Anna Goodman is a 56 y.o. female presenting on 06/10/2021 for Annual Exam ? ? ?HPI ? ?Here for Annual Physical and Lab Review. ? ?CHRONIC HTN: ? ?Home BP readings 188/96, 170/97, 204/104, 198/106, 170/90, 216/103 ? ?Reports history of HTN for years. Some difficulty with managing. She has been advised that weight is contributing. ?   ?Current Meds - Amlodipine-Valsartan-HCTZ 10-320-25mg  daily ?Did take meds today ?Lifestyle: ?- Diet: no sodium added. Limiting diet in take sodium. No coffee. Drinks tea occasional. Drinks mostly water. ?- Exercise: Limited activity ?- Admits some anxiety stress w/ husband's medical history ?Denies CP, dyspnea, HA, edema, dizziness / lightheadedness ? ?HYPERLIPIDEMIA: ?- Reports no concerns. Last lipid panel 06/2021, elevated LDL 149 ?- Currently taking Atorvastatin 10mg , tolerating well without side effects or myalgias ? ? ?Elevated TSH ?Very Mild elevated TSH 4.57, no prior history of this. ?  ?Obesity BMI >39 ?  ?OSA ?Confirmed on recent home PSG test. However she is unable to pursue CPAP Titration and CPAP machine due to cost at this time. ? ? ?Health Maintenance: ? ?Last Mammogram 05/22/19. Due for repeat. ? ?Due for pap will need referral ? ? ? ?  06/10/2021  ?  1:46 PM 05/10/2021  ?  3:25 PM 10/19/2011  ?  4:13 PM  ?Depression screen PHQ 2/9  ?Decreased Interest 0 0 0  ?Down, Depressed, Hopeless 0 0 0  ?PHQ - 2 Score 0 0 0  ?Altered sleeping 0 0   ?Tired, decreased energy 1 0   ?Change in appetite 2 0   ?Feeling bad or failure about yourself  0 0   ?Trouble concentrating 0 0   ?Moving slowly or fidgety/restless 0 0   ?Suicidal thoughts 0 0   ?PHQ-9 Score 3 0   ?Difficult doing work/chores Not difficult at all Not difficult at all   ? ? ?Past Medical History:  ?Diagnosis Date  ? Hypertension   ? Migraine   ? ?Past Surgical History:  ?Procedure Laterality Date  ? [place]  ?  [place]  ? ?Social History  ? ?Socioeconomic History  ? Marital status: Married  ?  Spouse name: Not on file  ? Number of children: Not on file  ? Years of education: Not on file  ? Highest education level: Not on file  ?Occupational History  ? Not on file  ?Tobacco Use  ? Smoking status: Former  ?  Types: Cigarettes  ? Smokeless tobacco: Never  ?Substance and Sexual Activity  ? Alcohol use: Yes  ?  Comment: drinks alcohol 2-4 times per month  ? Drug use: No  ? Sexual activity: Not on file  ?Other Topics Concern  ? Not on file  ?Social History Narrative  ? Not on file  ? ?Social Determinants of Health  ? ?Financial Resource Strain: Not on file  ?Food Insecurity: Not on file  ?Transportation Needs: Not on file  ?Physical Activity: Not on file  ?Stress: Not on file  ?Social Connections: Not on file  ?Intimate Partner Violence: Not on file  ? ?Family History  ?Problem Relation Age of Onset  ? Cancer Mother   ? Depression Mother   ? Hypertension Mother   ? Hypertension Father   ? Hypertension Sister   ? Asthma Brother   ? ?Current Outpatient Medications on File Prior to Visit  ?  Medication Sig  ? amLODIPine-Valsartan-HCTZ 10-320-25 MG TABS Take 1 tablet by mouth daily.  ? aspirin 81 MG EC tablet Take 1 tablet by mouth daily.  ? atorvastatin (LIPITOR) 10 MG tablet Take 1 tablet (10 mg total) by mouth daily.  ? ?No current facility-administered medications on file prior to visit.  ? ? ?Review of Systems  ?Constitutional:  Negative for activity change, appetite change, chills, diaphoresis, fatigue and fever.  ?HENT:  Negative for congestion and hearing loss.   ?Eyes:  Negative for visual disturbance.  ?Respiratory:  Negative for cough, chest tightness, shortness of breath and wheezing.   ?Cardiovascular:  Negative for chest pain, palpitations and leg swelling.  ?Gastrointestinal:  Negative for abdominal pain, constipation, diarrhea, nausea and vomiting.  ?Genitourinary:  Negative for dysuria, frequency and  hematuria.  ?Musculoskeletal:  Negative for arthralgias and neck pain.  ?Skin:  Negative for rash.  ?Neurological:  Negative for dizziness, weakness, light-headedness, numbness and headaches.  ?Hematological:  Negative for adenopathy.  ?Psychiatric/Behavioral:  Negative for behavioral problems, dysphoric mood and sleep disturbance.   ?Per HPI unless specifically indicated above ? ? ?   ?Objective:  ?  ?BP (!) 156/98   Pulse 69   Ht 5\' 5"  (1.651 m)   Wt 236 lb 3.2 oz (107.1 kg)   SpO2 100%   BMI 39.31 kg/m?   ?Wt Readings from Last 3 Encounters:  ?06/10/21 236 lb 3.2 oz (107.1 kg)  ?05/10/21 236 lb 6.4 oz (107.2 kg)  ?10/19/11 223 lb (101.2 kg)  ?  ?Physical Exam ?Vitals and nursing note reviewed.  ?Constitutional:   ?   General: She is not in acute distress. ?   Appearance: She is well-developed. She is obese. She is not diaphoretic.  ?   Comments: Well-appearing, comfortable, cooperative  ?HENT:  ?   Head: Normocephalic and atraumatic.  ?Eyes:  ?   General:     ?   Right eye: No discharge.     ?   Left eye: No discharge.  ?   Conjunctiva/sclera: Conjunctivae normal.  ?   Pupils: Pupils are equal, round, and reactive to light.  ?Neck:  ?   Thyroid: No thyromegaly.  ?Cardiovascular:  ?   Rate and Rhythm: Normal rate and regular rhythm.  ?   Pulses: Normal pulses.  ?   Heart sounds: Normal heart sounds. No murmur heard. ?Pulmonary:  ?   Effort: Pulmonary effort is normal. No respiratory distress.  ?   Breath sounds: Normal breath sounds. No wheezing or rales.  ?Abdominal:  ?   General: Bowel sounds are normal. There is no distension.  ?   Palpations: Abdomen is soft. There is no mass.  ?   Tenderness: There is no abdominal tenderness.  ?Musculoskeletal:     ?   General: No tenderness. Normal range of motion.  ?   Cervical back: Normal range of motion and neck supple.  ?   Comments: Upper / Lower Extremities: ?- Normal muscle tone, strength bilateral upper extremities 5/5, lower extremities 5/5  ?Lymphadenopathy:   ?   Cervical: No cervical adenopathy.  ?Skin: ?   General: Skin is warm and dry.  ?   Findings: No erythema or rash.  ?Neurological:  ?   Mental Status: She is alert and oriented to person, place, and time.  ?   Comments: Distal sensation intact to light touch all extremities  ?Psychiatric:     ?   Mood and Affect: Mood normal.     ?  Behavior: Behavior normal.     ?   Thought Content: Thought content normal.  ?   Comments: Well groomed, good eye contact, normal speech and thoughts  ? ? ?I have personally reviewed the radiology report from 3.18.21 Mammogram. ? ?Mammo screening breast tomosynthesis bilateral ? ?Anatomical Region Laterality Modality  ?Breast Bilateral bilateral Mammography  ?Breast Left -- --  ?Breast Right -- --  ? ?Impression ? ?No mammographic evidence of malignancy. Recommend routine mammography  ?screening in one year.  ? ?The exam was electronically reviewed by a staff physician.  ? ?BI-RADS: 1 - Negative ?Narrative ? ?EXAM:  ?MAMMO SCREEN BREAST WITH TOMOSYNTHESIS BILATERAL 05/22/2019 11:20 AM  ? ?INDICATION:  ?Screening  ? ?COMPARISON:  ?Compared to: 12/19/2016 Mammo screening digital bilateral and 12/19/2016  ?Mammo screening breast tomosynthesis bilateral  ? ?TECHNIQUE:  ?Tomosynthesis images were obtained as part of this exam.  ? ?FINDINGS:  ?The breasts have scattered areas of fibroglandular density.  ? ?There are no suspicious masses, calcifications, or other findings in  ?either breast. ?Exam End: 05/22/19 11:29   ?Specimen Collected: 05/22/19 11:24 Last Resulted: 05/22/19 11:33  ?Received From: [place]  Result Received: 05/10/21 14:57  ? ?I have personally reviewed the radiology report from 01/21/16 ? ?Pap Smear ?Specimen:  AP Specimen - Vagina and cervix (combined site) (body structure) ?Component 5 yr ago  ?Case Report  Gynecologic Cytology Report                       Case: [id number]                                ?Authorizing Provider:  Michae Kava, MD         Collected:           01/21/2016 1522              ?Ordering Location:     [facility]        Received:            01/24/2016 1738              ?                       [place]

## 2021-06-10 NOTE — Patient Instructions (Addendum)
Thank you for coming to the office today. ? ?Future Shingles vaccines Shingrix 2 doses, 2-6 months apart. ? ?--------------------------------------------------------------------- ? ?For Mammogram screening for breast cancer  ? ?Call the Imaging Center below anytime to schedule your own appointment now that order has been placed. ? ?[facility] ?[facility] ?[address] ?[place], [place] [address] ?Phone: [phone] ? ?[facility] for cervical cancer screening pap smear. Last we can find on record was 2017. ? ?--------------------------------------------------- ? ?Referral to Dr Chilton Si for blood pressure ([facility]) ? ?Still recommend treating the sleep apnea - can trial a oral appliance, or if ready / cost works - can pursue the 2nd Sleep Study and CPAP machine. ? ?------------------------------------------------ ? ?[facility] ?[address] ?[address] ?[place], [place] [address] ?[phone] ? ?----------------------------------------------------------------------- ? ?For mild to moderate Obstructive Sleep Apnea and/or problems with Snoring, a custom dental oral appliance may help improve breathing while sleeping and treat these problems. ? ?Please check into these available local options to see if they are in your network and if an oral appliance is a good fit for you. If this option does not work or if you would prefer, then sleep study if you haven't done it already or CPAP machine are the next options. ? ?[facility] ?[facility] ?[address] ?[place], [place] [address] ?P: [phone] ? ?[facility] ?[address] ?[place], [place] [address] ?[phone] ? ?[facility], DDS ?[address] ?[place], [place] [address] ?[phone] ? ?[facility] ?[address] ?[place], [place]  [address] ?[phone] ? ?[facility] ?[address] ?[place], [place] [address] ?[phone] ? ?[facility] ?[address] ?[place], [place] [address] ?[phone] ? ? ? ?Please schedule a Follow-up Appointment to: Return in about 3 months (around 09/09/2021) for 3 month follow-up HTN, OSA, specialist updates. ? ?If you have any other questions or concerns, please feel free to call the office or send a message through MyChart. You may also schedule an earlier appointment if necessary. ? ?Additionally, you may be receiving a survey about your experience at our office within a few days to 1 week by e-mail or mail. We value your feedback. ? ?Saralyn Pilar, DO ?[facility], [place] ?

## 2021-06-17 ENCOUNTER — Ambulatory Visit (INDEPENDENT_AMBULATORY_CARE_PROVIDER_SITE_OTHER): Payer: 59 | Admitting: Pharmacist

## 2021-06-17 DIAGNOSIS — I10 Essential (primary) hypertension: Secondary | ICD-10-CM

## 2021-06-17 DIAGNOSIS — G4733 Obstructive sleep apnea (adult) (pediatric): Secondary | ICD-10-CM

## 2021-06-17 NOTE — Progress Notes (Signed)
?Chronic Care Management  ? ?Outreach Note ? ?06/17/2021 ?Name: Anna Goodman MRN: [id number] DOB: 1966-01-16 ? ?I connected with Anna Goodman on 06/17/21 by telephone outreach and verified that I am speaking with the correct person using two identifiers. ? ?Patient appearing on report for [place] Metric Hypertension Control due to last documented ambulatory pressure of 186/110 on 05/10/2021. Next appointment with PCP is 09/21/2021. ? ?Patient recently seen for Office Visit with PCP on 06/10/2021. BP elevated in office, reading: 156/98, HR 69. Provider advised on importance of treating OSA (note patient reported unable to pursue CPAP Titration and CPAP machine due to cost at this time). Referral placed to [facility] ? ?Outreached patient to discuss hypertension control and medication management.  ? ?Outpatient Encounter Medications as of 06/17/2021  ?Medication Sig  ? amLODIPine-Valsartan-HCTZ 10-320-25 MG TABS Take 1 tablet by mouth daily.  ? aspirin 81 MG EC tablet Take 1 tablet by mouth daily.  ? atorvastatin (LIPITOR) 10 MG tablet Take 1 tablet (10 mg total) by mouth daily.  ? ?No facility-administered encounter medications on file as of 06/17/2021.  ? ? ?Lab Results  ?Component Value Date  ? CREATININE 1.20 (H) 06/03/2021  ? BUN 30 (H) 06/03/2021  ? NA 139 06/03/2021  ? K 4.3 06/03/2021  ? CL 102 06/03/2021  ? CO2 26 06/03/2021  ? ? ?BP Readings from Last 3 Encounters:  ?06/10/21 (!) 156/98  ?05/10/21 (!) 186/110  ?10/19/11 (!) 164/86  ? ? ?Pulse Readings from Last 3 Encounters:  ?06/10/21 69  ?05/10/21 83  ?09/23/08 77  ? ? ?Current medications: amlodipine-valsartan-HCTZ 10-320-25 mg daily ?Denies missed doses ?  ?Patient obtained and using automated upper arm home BP machine. ?Reports has been monitoring home BP, but does not have readings to review today ?  ?  ?Current physical activity: reports has started walking 10-12 minutes x 2-3 days/week ?  ?Does not add salt to her food. States now  eating more fresh fruits and vegetables (less take out) since refrigerator repaired ? ?OSA/CPAP ?Note patient previously stated unable to pursue CPAP Titration and CPAP machine due to cost at this time ?Follow up today regarding CPAP. Patient reports contacted sleep lab/insurance and found that her insurance information had been entered incorrectly. Reports requested claim for CPAP be re-run and will follow up with insurance. ?Patient interested in other resources that might be available to help with cost of CPAP. Referral placed to Care Guide team ?  ?Assessment/Plan: ?Currently uncontrolled ?Reviewed appropriate administration of medication regimen ?Counseled on long term microvascular and macrovascular complications of uncontrolled hypertension ?Discussed dietary modifications, such as reduced salt intake, focus on whole grains, vegetables, lean proteins ?Discussed goal of 150 minutes of moderate intensity physical activity weekly  ?Reviewed appropriate home BP monitoring technique (avoid caffeine, smoking, and exercise for 30 minutes before checking, rest for at least 5 minutes before taking BP, sit with feet flat on the floor and back against a hard surface, uncross legs, and rest arm on flat surface) ?Reviewed to check blood pressure, document, and provide at next provider visit ?Have advised patient if blood pressure reading is 180/120 or greater and experiencing any symptoms of headache, chest pain, shortness of breath, back pain, numbness/weakness, change in vision, or difficulty speaking to seek emergency care ?Referral sent to Care Guide team ?Patient to follow up with [facility] next week for scheduling if has not heard back from clinic regarding referral from PCP by that time ? ? ? ?  Follow Up Plan: CM Pharmacist will outreach to patient by telephone again on 07/15/2021 at 10:45 AM ? ?Wallace Cullens, PharmD, BCACP ?Clinical Pharmacist ?[facility] ?[phone] ? ?

## 2021-06-17 NOTE — Patient Instructions (Signed)
Check your blood pressure. ? ?We recommend a blood pressure cuff that goes around your upper arm, as these are generally the most accurate.  ? ?To appropriately check your blood pressure, make sure you do the following:  ?1) Avoid caffeine, exercise, or tobacco products for 30 minutes before checking. ?2) Sit with your back supported in a flat-backed chair. Rest your arm on something flat (arm of the chair, table, etc). ?3) Sit still with your feet flat on the floor, resting, for at least 5 minutes.  ?4) Check your blood pressure. Take 1-2 readings.  ? ?Write down these readings and bring with you to any provider appointments. Bring your home blood pressure machine with you to a provider's office for accuracy comparison at least once a year. Make sure you take your blood pressure medications before you come to any office visit. ? ? ?Wallace Cullens, PharmD, BCACP, CPP ?Clinical Pharmacist ?[facility] ?[place] ?[phone] ? ?

## 2021-06-20 ENCOUNTER — Telehealth: Payer: Self-pay

## 2021-06-20 NOTE — Telephone Encounter (Signed)
Patient is scheduled for 07/15/21 with LMD

## 2021-06-20 NOTE — Telephone Encounter (Signed)
-----   Message from Excell Seltzer, RN sent at 06/15/2021 11:07 AM EDT ----- Regarding: referral Please schedule with CNM/PA for annual exam including pap - 4-6 weeks.

## 2021-06-20 NOTE — Telephone Encounter (Signed)
Anna Goodman medical referring for Annual exam including pap. Sch CNM/ PA 4-6 weeks. I contacted patient via phone. No answer. Voicemail is full unable to leave message.

## 2021-06-20 NOTE — Telephone Encounter (Signed)
? ?  Telephone encounter was:  Unsuccessful.  06/20/2021 ?Name: Anna Goodman MRN: [id number] DOB: Jul 27, 1965 ? ?Unsuccessful outbound call made today to assist with:   CPAP ? ?Outreach Attempt:  1st Attempt ? ?Unable to leave message on home or mobile voicemail is full. ? ?Toddrick Sanna, AAS Paralegal, CHC ?Care Guide  Embedded Care Coordination ?[facility]  ?[address] ?[place], [place] [address] ???[email or web address]  ?? [phone]   ?[email or web address] ?  ?

## 2021-06-21 ENCOUNTER — Telehealth: Payer: Self-pay

## 2021-06-21 NOTE — Telephone Encounter (Signed)
? ?  Telephone encounter was:  Successful.  ?06/21/2021 ?Name: Anna Goodman MRN: [id number] DOB: [birth year] ? ?Anna Goodman is a 56 y.o. year old female who is a primary care patient of Olin Hauser, DO . The community resource team was consulted for assistance with  CPAP machine, mask ? ?Care guide performed the following interventions: Spoke with patient to inform her I have spoken to [employer] at [facility] and they have CPAP machines and masks available.  Juliann Pulse ask that I have the patient call her to schedule an appointment.  Also verified patient's email and sent information for the American Sleep Apnea Association CPAP Assistance Program. ? ?Follow Up Plan:  Care guide will follow up with patient by phone over the next 7 days ? ?Kinzi Frediani, [place], CHC ?Care Guide  Embedded Care Coordination ?[facility]  [facility]  ?[address] ?[place], [place] [address] ?[email or web address]  ?? [phone]   ?[email or web address] ?  ?

## 2021-06-22 ENCOUNTER — Telehealth (HOSPITAL_BASED_OUTPATIENT_CLINIC_OR_DEPARTMENT_OTHER): Payer: Self-pay | Admitting: *Deleted

## 2021-06-22 NOTE — Telephone Encounter (Signed)
Anna Si, MD  Smitty Cords, DO; Burnell Blanks, LPN ?Thank you very much.  We will get her in our clinic.  ? ?Juliette Alcide, can you please help get this patient in ADV HTN clinic?  ? ?~Tiffany  ? ?Patient scheduled to see Dr Duke Salvia 5/24, she is aware of location  ?

## 2021-06-28 ENCOUNTER — Telehealth: Payer: Self-pay

## 2021-06-28 NOTE — Telephone Encounter (Signed)
? ?  Telephone encounter was:  Successful.  ?06/28/2021 ?Name: NATAJAH DERDERIAN MRN: [id number] DOB: 11-Sep-1965 ? ?COSANDRA PLOUFFE is a 56 y.o. year old female who is a primary care patient of Smitty Cords, DO . The community resource team was consulted for assistance with  CPAP machine and mask resources ? ?Care guide performed the following interventions:  Spoke with patient she has not had time to call [name] at [facility] regarding the CPAP machine and mask.  I gave her the contact information and she stated she would call today.  Verified email address resent information. Letter saved in Epic. ? ?Follow Up Plan:  No further follow up planned at this time. The patient has been provided with needed resources. ? ?Sanora Cunanan, AAS Paralegal, CHC ?Care Guide  Embedded Care Coordination ?[facility]  [facility]  ?[address] ?[place], [place] [address] ?[email or web address]  ?? [phone]   ?[email or web address] ?  ?

## 2021-07-15 ENCOUNTER — Encounter: Payer: Self-pay | Admitting: Licensed Practical Nurse

## 2021-07-15 ENCOUNTER — Ambulatory Visit: Payer: 59 | Admitting: Pharmacist

## 2021-07-15 DIAGNOSIS — I10 Essential (primary) hypertension: Secondary | ICD-10-CM

## 2021-07-15 NOTE — Chronic Care Management (AMB) (Signed)
?Chronic Care Management  ? ?Outreach Note ? ?07/15/2021 ?Name: Anna Goodman MRN: [id number] DOB: Oct 19, 1965 ? ?I connected with Natalynn Pedone Cormier on 07/15/21 by telephone outreach and verified that I am speaking with the correct person using two identifiers. ? ?Patient appearing on report for [place] Metric Hypertension Control due to last documented ambulatory pressure of 186/110 on 05/10/2021. Next appointment with PCP is 09/21/2021. ? ?Outreached patient to discuss hypertension control and medication management.  ? ?Outpatient Encounter Medications as of 07/15/2021  ?Medication Sig  ? amLODIPine-Valsartan-HCTZ 10-320-25 MG TABS Take 1 tablet by mouth daily.  ? aspirin 81 MG EC tablet Take 1 tablet by mouth daily.  ? atorvastatin (LIPITOR) 10 MG tablet Take 1 tablet (10 mg total) by mouth daily.  ? ?No facility-administered encounter medications on file as of 07/15/2021.  ? ? ?Lab Results  ?Component Value Date  ? CREATININE 1.20 (H) 06/03/2021  ? BUN 30 (H) 06/03/2021  ? NA 139 06/03/2021  ? K 4.3 06/03/2021  ? CL 102 06/03/2021  ? CO2 26 06/03/2021  ? ? ?BP Readings from Last 3 Encounters:  ?06/10/21 (!) 156/98  ?05/10/21 (!) 186/110  ?10/19/11 (!) 164/86  ? ? ?Pulse Readings from Last 3 Encounters:  ?06/10/21 69  ?05/10/21 83  ?09/23/08 77  ? ? ?Current medications: amlodipine-valsartan-HCTZ 10-320-25 mg daily ?  ?Reports using automated upper arm home BP machine ?Reports has been monitoring home BP, but does not have readings to review today.  ?Recalls recent readings ranging 156-190/80s-90s ?Recalls higher reading of systolic blood pressure of 190 occurred after missed a dose of her amlodipine-valsartan-HCTZ 10-320-25 mg ?Counsel patient on importance of medication adherence. Encourage patient to use weekly pillbox, as well as to start using daily phone alarm to aid with adherence ?  ?  ?Current physical activity: reports walking 10-12 minutes x 2-3 days/week and has added doing leg lifts and squats ? ?Caffeine:  reports drinks ~8 oz of unsweet tea/day ?  ?Does not add salt to her food. States now eating more fresh fruits and vegetables (less take out) since refrigerator repaired ?  ?OSA/CPAP ?Note patient previously stated unable to pursue CPAP Titration and CPAP machine due to cost at this time ?Note Care Guide reached out to patient regarding resource to help with affordability of obtaining CPAP ?Today patient states that she has called and left a message with [name] at [facility] regarding the CPAP machine and mask and will call to follow up ? ?Assessment/Plan: ?Currently uncontrolled ?Patient planning to attend upcoming appointment with Dr. Duke Salvia [facility] on 07/27/2021 ?Reviewed appropriate administration of medication regimen ?Counseled on long term microvascular and macrovascular complications of uncontrolled hypertension ?Discussed dietary modifications, such as reduced salt intake, focus on whole grains, vegetables, lean proteins ?Discussed goal of 150 minutes of moderate intensity physical activity weekly  ?Reviewed appropriate home BP monitoring technique (avoid caffeine, smoking, and exercise for 30 minutes before checking, rest for at least 5 minutes before taking BP, sit with feet flat on the floor and back against a hard surface, uncross legs, and rest arm on flat surface) ?Reviewed to check blood pressure, document, and bring to upcoming appointment on 5/24 ?Have advised patient if blood pressure reading is 180/120 or greater and experiencing any symptoms of headache, chest pain, shortness of breath, back pain, numbness/weakness, change in vision, or difficulty speaking to seek emergency care ?Discussed importance of medication adherence and patient to start using daily phone alarm as  adherence aid ? ? ?Follow Up Plan: CM Pharmacist will outreach to patient by telephone again on 6/26 at 10:45 am ? ?Estelle Grumbles, PharmD, BCACP ?Clinical Pharmacist ?[facility] ?[place] ?[phone] ? ?

## 2021-07-15 NOTE — Patient Instructions (Signed)
Check your blood pressure daily, and any time you have concerning symptoms like headache, chest pain, dizziness, shortness of breath, or vision changes.  ? ?To appropriately check your blood pressure, make sure you do the following:  ?1) Avoid caffeine, exercise, or tobacco products for 30 minutes before checking. Empty your bladder. ?2) Sit with your back supported in a flat-backed chair. Rest your arm on something flat (arm of the chair, table, etc). ?3) Sit still with your feet flat on the floor, resting, for at least 5 minutes.  ?4) Check your blood pressure. Take 1-2 readings.  ?5) Write down these readings and bring with you to any provider appointments. ? ?Bring your home blood pressure machine with you to a provider's office for accuracy comparison at least once a year.  ? ?Make sure you take your blood pressure medications before you come to any office visit, even if you were asked to fast for labs. ? ?Thank you! ? ?Estelle Grumbles, PharmD, BCACP ?Clinical Pharmacist ?[facility] ?[place] ?[phone] ? ?

## 2021-07-27 ENCOUNTER — Encounter (HOSPITAL_BASED_OUTPATIENT_CLINIC_OR_DEPARTMENT_OTHER): Payer: Self-pay | Admitting: Cardiovascular Disease

## 2021-07-27 ENCOUNTER — Ambulatory Visit (INDEPENDENT_AMBULATORY_CARE_PROVIDER_SITE_OTHER): Payer: 59 | Admitting: Cardiovascular Disease

## 2021-07-27 DIAGNOSIS — E782 Mixed hyperlipidemia: Secondary | ICD-10-CM

## 2021-07-27 DIAGNOSIS — Z006 Encounter for examination for normal comparison and control in clinical research program: Secondary | ICD-10-CM

## 2021-07-27 DIAGNOSIS — G4733 Obstructive sleep apnea (adult) (pediatric): Secondary | ICD-10-CM | POA: Diagnosis not present

## 2021-07-27 DIAGNOSIS — I10 Essential (primary) hypertension: Secondary | ICD-10-CM | POA: Diagnosis not present

## 2021-07-27 MED ORDER — SPIRONOLACTONE 25 MG PO TABS: 25.0000 mg | ORAL_TABLET | Freq: Every day | ORAL | 3 refills | Status: DC

## 2021-07-27 MED ORDER — ATORVASTATIN CALCIUM 40 MG PO TABS: 40.0000 mg | ORAL_TABLET | Freq: Every day | ORAL | 3 refills | Status: DC

## 2021-07-27 NOTE — Progress Notes (Signed)
[facility] Initial Assessment:    Date:  07/27/2021   ID:  Anna Shoveoni T Roughton, DOB 1965/07/25, MRN [id number]  PCP:  Smitty CordsKaramalegos, Alexander J, DO  Cardiologist:  None  Nephrologist:  Referring MD: Saralyn PilarKaramalegos, Alexander *   CC: Hypertension  History of Present Illness:    Anna Goodman is a 56 y.o. female with a hx of hypertension and OSA here to establish care in the [facility].  She has struggled with hypertension for years.  She last saw her PCP 06/2021 and her blood pressure was 156/98 on max dose valsartan/HCTZ/amlodipine.  Blood pressures at home were even higher on her machine.  Therefore she was transferred to the [facility].  She does have recent diagnosis of OSA but has been unable to get a CPAP due to cost.  She notes that her blood pressure has been hard to control in the last 10 years. She was initially diagnosed she has been off and on medications at times due to lack of insurance.  Lately her blood pressure has been in the 1 70-1 90s over 80s to 110s.  She has started trying to do more walking.  She walks with her coworkers for 15 to 20 minutes/day.  She sometimes feels like her heart is racing when she walks.  She has no chest pain but does sometimes feel exhausted and gets a tingling sensation in her arms.  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  She mostly cooks at home but lately has had to eat out more because her refrigerator broke.  She now has a new refrigerator and is working to [employer] her supply.  Previous antihypertensives: Carvedilol Lisinopril  Past Medical History:  Diagnosis Date   Hypertension    Migraine     Past Surgical History:  Procedure Laterality Date   CESAREAN SECTION  1988   CESAREAN SECTION  1992    Current Medications: Current Meds  Medication Sig   amLODIPine-Valsartan-HCTZ 10-320-25 MG TABS Take 1 tablet by mouth daily.   aspirin 81 MG  EC tablet Take 1 tablet by mouth daily.   spironolactone (ALDACTONE) 25 MG tablet Take 1 tablet (25 mg total) by mouth daily.   [DISCONTINUED] atorvastatin (LIPITOR) 10 MG tablet Take 1 tablet (10 mg total) by mouth daily.     Allergies:   Hydrocodone-acetaminophen   Social History   Socioeconomic History   Marital status: Married    Spouse name: Not on file   Number of children: Not on file   Years of education: Not on file   Highest education level: Not on file  Occupational History   Not on file  Tobacco Use   Smoking status: Former    Types: Cigarettes   Smokeless tobacco: Never  Substance and Sexual Activity   Alcohol use: Yes    Comment: drinks alcohol 2-4 times per month   Drug use: No   Sexual activity: Not on file  Other Topics Concern   Not on file  Social History Narrative   Not on file   Social Determinants of Health   Financial Resource Strain: Low Risk    Difficulty of Paying Living Expenses: Not hard at all  Food Insecurity: No Food Insecurity   Worried About Programme researcher, broadcasting/film/videounning Out of Food in the Last Year: Never true   Ran Out of Food in the Last Year: Never true  Transportation Needs: No Transportation Needs   Lack of Transportation (  Medical): No   Lack of Transportation (Non-Medical): No  Physical Activity: Insufficiently Active   Days of Exercise per Week: 4 days   Minutes of Exercise per Session: 20 min  Stress: Not on file  Social Connections: Not on file     Family History: The patient's family history includes Asthma in her brother; Cancer in her mother; Coronary artery disease in her mother; Depression in her mother; Heart attack in her sister; Hypertension in her father, mother, and sister; Sleep apnea in her mother and sister; Stroke in her mother and sister.  ROS:   Please see the history of present illness.    All other systems reviewed and are negative.  EKGs/Labs/Other Studies Reviewed:    EKG:  EKG is ordered today.  The ekg ordered today  demonstrates sinus rhythm.  Rate 68 bpm.  LVH  Recent Labs: 06/03/2021: ALT 13; BUN 30; Creat 1.20; Hemoglobin 13.8; Platelets 181; Potassium 4.3; Sodium 139; TSH 4.57   Recent Lipid Panel    Component Value Date/Time   CHOL 251 (H) 06/03/2021 0840   TRIG 229 (H) 06/03/2021 0840   HDL 65 06/03/2021 0840   CHOLHDL 3.9 06/03/2021 0840   LDLCALC 149 (H) 06/03/2021 0840   LDLDIRECT 86 09/23/2008 2307    Physical Exam:   VS:  BP (!) 192/88 (BP Location: Left Arm)   Pulse 68   Ht 5\' 5"  (1.651 m)   Wt 239 lb 3.2 oz (108.5 kg)   BMI 39.80 kg/m  , BMI Body mass index is 39.8 kg/m. GENERAL:  Well appearing HEENT: Pupils equal round and reactive, fundi not visualized, oral mucosa unremarkable NECK:  No jugular venous distention, waveform within normal limits, carotid upstroke brisk and symmetric, no bruits, no thyromegaly LUNGS:  Clear to auscultation bilaterally HEART:  RRR.  PMI not displaced or sustained,S1 and S2 within normal limits, no S3, no S4, no clicks, no rubs, no murmurs ABD:  Flat, positive bowel sounds normal in frequency in pitch, no bruits, no rebound, no guarding, no midline pulsatile mass, no hepatomegaly, no splenomegaly EXT:  2 plus pulses throughout, 2+ LE edema to the ankles, no cyanosis no clubbing SKIN:  No rashes no nodules NEURO:  Cranial nerves II through XII grossly intact, motor grossly intact throughout PSYCH:  Cognitively intact, oriented to person place and time   ASSESSMENT/PLAN:    Essential hypertension Blood pressures remain very elevated despite being on maximum doses of valsartan/HCTZ/amlodipine.  Recommend adding spironolactone 25 mg daily.  Check a BMP in a week she is working to get a CPAP machine.  Thyroid function has been checked and is normal.  We are going to check renal artery Dopplers to rule out renal artery stenosis.  Given that her blood pressures are elevated and she is on a combination pill, we will not hold it at this time to check for  hyperaldosteronism.  She is going to really focus on lifestyle changes.  She was congratulated on increasing her exercise and encouraged to get 250 minutes weekly.  She is also going to start working on eating at home more and limiting her sodium intake.  She does consent to monitoring in our [employer] remote patient monitoring system in the remote patient monitoring study.  OSA (obstructive sleep apnea) Unable to afford CPAP. She is working on this.   Screening for Secondary Hypertension:     07/27/2021    4:18 PM  Causes  Drugs/Herbals Screened     - Comments eats out  but trying to limit it.  Limited caffeine.  Rare EtOH.  High NSAID use  Renovascular HTN Screened     - Comments Check renal artery Dopplers  Sleep Apnea Screened     - Comments She has sleep apnea and is working to get a machine.  Thyroid Disease Screened     - Comments TSH very mildly abnormal.  Hyperaldosteronism Screened     - Comments Consider checking renin/Aldo once we are able to hold the valsartan and blood pressure is little better controlled  Pheochromocytoma N/A  Cushing's Syndrome Screened     - Comments Consider cortisol at follow-up  Hyperparathyroidism Screened     - Comments Calcium levels normal  Coarctation of the Aorta Screened     - Comments BP symmetric on repeat  Compliance Screened    Relevant Labs/Studies:    Latest Ref Rng & Units 06/03/2021    8:40 AM 10/08/2012   12:00 PM 10/10/2011    4:07 PM  Basic Labs  Sodium 135 - 146 mmol/L 139   140   142    Potassium 3.5 - 5.3 mmol/L 4.3   3.9   3.9    Creatinine 0.50 - 1.03 mg/dL 2.67   1.24   5.80         Latest Ref Rng & Units 06/03/2021    8:40 AM  Thyroid   TSH 0.40 - 4.50 mIU/L 4.57                  07/27/2021    4:52 PM  Renovascular   Renal Artery [place] Completed Yes    Disposition:    FU with MD/PharmD in 2 months    Medication Adjustments/Labs and Tests Ordered: Current medicines are reviewed at length with the patient today.   Concerns regarding medicines are outlined above.  Orders Placed This Encounter  Procedures   Basic metabolic panel   EKG 12-Lead   VAS US RENAL ARTERY DUPLEX   Meds ordered this encounter  Medications   spironolactone (ALDACTONE) 25 MG tablet    Sig: Take 1 tablet (25 mg total) by mouth daily.    Dispense:  90 tablet    Refill:  3   atorvastatin (LIPITOR) 40 MG tablet    Sig: Take 1 tablet (40 mg total) by mouth daily.    Dispense:  90 tablet    Refill:  3     Signed, Chilton Si, MD  07/27/2021 6:36 PM    [facility]

## 2021-07-27 NOTE — Addendum Note (Signed)
Addended by: Sharyl Nimrod on: 07/27/2021 06:37 PM   Modules accepted: Orders

## 2021-07-27 NOTE — Patient Instructions (Addendum)
Medication Instructions:  Your physician has recommended you make the following change in your medication:   Start: Spironolactone 25mg  tablet daily   Change: Atorvastatin 40mg  tablet daily    Labwork: Please return for Lab work in one week for BMP . You may come to the...   [facility] (3rd floor) [address], [place], [address] [place]  Open: 8am-Noon and 1pm-4:30pm   [facility] at [facility] [id number]- See [facility] for Locations  **no appointments needed**   Testing/Procedures: Your physician has requested that you have a renal artery duplex. During this test, an ultrasound is used to evaluate blood flow to the kidneys. Allow one hour for this exam. Do not eat after midnight the day before and avoid carbonated beverages. Take your medications as you usually do.   Follow-Up: 09/26/2021 3:30 PM WITH PHARM D AT [facility] OFFICE  10/21/2021 4:00 PM WITH DR [facility]   Special Instructions:  DASH Eating Plan DASH stands for "Dietary Approaches to Stop Hypertension." The DASH eating plan is a healthy eating plan that has been shown to reduce high blood pressure (hypertension). It may also reduce your risk for type 2 diabetes, heart disease, and stroke. The DASH eating plan may also help with weight loss. What are tips for following this plan?  General guidelines Avoid eating more than 2,300 mg (milligrams) of salt (sodium) a day. If you have hypertension, you may need to reduce your sodium intake to 1,500 mg a day. Limit alcohol intake to no more than 1 drink a day for nonpregnant women and 2 drinks a day for men. One drink equals 12 oz of beer, 5 oz of wine, or 1 oz of hard liquor. Work with your health care provider to maintain a healthy body weight or to lose weight. Ask what an ideal weight is for you. Get at least 30 minutes of exercise that causes your heart to beat faster (aerobic exercise) most days of the week.  Activities may include walking, swimming, or biking. Work with your health care provider or diet and nutrition specialist (dietitian) to adjust your eating plan to your individual calorie needs. Reading food labels  Check food labels for the amount of sodium per serving. Choose foods with less than 5 percent of the Daily Value of sodium. Generally, foods with less than 300 mg of sodium per serving fit into this eating plan. To find whole grains, look for the word "whole" as the first word in the ingredient list. Shopping Buy products labeled as "low-sodium" or "no salt added." Buy fresh foods. Avoid canned foods and premade or frozen meals. Cooking Avoid adding salt when cooking. Use salt-free seasonings or herbs instead of table salt or sea salt. Check with your health care provider or pharmacist before using salt substitutes. Do not fry foods. Cook foods using healthy methods such as baking, boiling, grilling, and broiling instead. Cook with heart-healthy oils, such as olive, canola, soybean, or sunflower oil. Meal planning Eat a balanced diet that includes: 5 or more servings of fruits and vegetables each day. At each meal, try to fill half of your plate with fruits and vegetables. Up to 6-8 servings of whole grains each day. Less than 6 oz of lean meat, poultry, or fish each day. A 3-oz serving of meat is about the same size as a deck of cards. One egg equals 1 oz. 2 servings of low-fat dairy each day. A serving of nuts, seeds,  or beans 5 times each week. Heart-healthy fats. Healthy fats called Omega-3 fatty acids are found in foods such as flaxseeds and coldwater fish, like sardines, salmon, and mackerel. Limit how much you eat of the following: Canned or prepackaged foods. Food that is high in trans fat, such as fried foods. Food that is high in saturated fat, such as fatty meat. Sweets, desserts, sugary drinks, and other foods with added sugar. Full-fat dairy products. Do not salt  foods before eating. Try to eat at least 2 vegetarian meals each week. Eat more home-cooked food and less restaurant, buffet, and fast food. When eating at a restaurant, ask that your food be prepared with less salt or no salt, if possible. What foods are recommended? The items listed may not be a complete list. Talk with your dietitian about what dietary choices are best for you. Grains Whole-grain or whole-wheat bread. Whole-grain or whole-wheat pasta. Brown rice. Orpah Cobbatmeal. Quinoa. Bulgur. Whole-grain and low-sodium cereals. Pita bread. Low-fat, low-sodium crackers. Whole-wheat flour tortillas. Vegetables Fresh or frozen vegetables (raw, steamed, roasted, or grilled). Low-sodium or reduced-sodium tomato and vegetable juice. Low-sodium or reduced-sodium tomato sauce and tomato paste. Low-sodium or reduced-sodium canned vegetables. Fruits All fresh, dried, or frozen fruit. Canned fruit in natural juice (without added sugar). Meat and other protein foods Skinless chicken or Malawiturkey. Ground chicken or Malawiturkey. Pork with fat trimmed off. Fish and seafood. Egg whites. Dried beans, peas, or lentils. Unsalted nuts, nut butters, and seeds. Unsalted canned beans. Lean cuts of beef with fat trimmed off. Low-sodium, lean deli meat. Dairy Low-fat (1%) or fat-free (skim) milk. Fat-free, low-fat, or reduced-fat cheeses. Nonfat, low-sodium ricotta or cottage cheese. Low-fat or nonfat yogurt. Low-fat, low-sodium cheese. Fats and oils Soft margarine without trans fats. Vegetable oil. Low-fat, reduced-fat, or light mayonnaise and salad dressings (reduced-sodium). Canola, safflower, olive, soybean, and sunflower oils. Avocado. Seasoning and other foods Herbs. Spices. Seasoning mixes without salt. Unsalted popcorn and pretzels. Fat-free sweets. What foods are not recommended? The items listed may not be a complete list. Talk with your dietitian about what dietary choices are best for you. Grains Baked goods made  with fat, such as croissants, muffins, or some breads. Dry pasta or rice meal packs. Vegetables Creamed or fried vegetables. Vegetables in a cheese sauce. Regular canned vegetables (not low-sodium or reduced-sodium). Regular canned tomato sauce and paste (not low-sodium or reduced-sodium). Regular tomato and vegetable juice (not low-sodium or reduced-sodium). Rosita FirePickles. Olives. Fruits Canned fruit in a light or heavy syrup. Fried fruit. Fruit in cream or butter sauce. Meat and other protein foods Fatty cuts of meat. Ribs. Fried meat. Tomasa BlaseBacon. Sausage. Bologna and other processed lunch meats. Salami. Fatback. Hotdogs. Bratwurst. Salted nuts and seeds. Canned beans with added salt. Canned or smoked fish. Whole eggs or egg yolks. Chicken or Malawiturkey with skin. Dairy Whole or 2% milk, cream, and half-and-half. Whole or full-fat cream cheese. Whole-fat or sweetened yogurt. Full-fat cheese. Nondairy creamers. Whipped toppings. Processed cheese and cheese spreads. Fats and oils Butter. Stick margarine. Lard. Shortening. Ghee. Bacon fat. Tropical oils, such as coconut, palm kernel, or palm oil. Seasoning and other foods Salted popcorn and pretzels. Onion salt, garlic salt, seasoned salt, table salt, and sea salt. Worcestershire sauce. Tartar sauce. Barbecue sauce. Teriyaki sauce. Soy sauce, including reduced-sodium. Steak sauce. Canned and packaged gravies. Fish sauce. Oyster sauce. Cocktail sauce. Horseradish that you find on the shelf. Ketchup. Mustard. Meat flavorings and tenderizers. Bouillon cubes. Hot sauce and Tabasco sauce. Premade or packaged marinades.  Premade or packaged taco seasonings. Relishes. Regular salad dressings. Where to find more information: National Heart, Lung, and Blood Institute: PopSteam.is American Heart Association: www.heart.org Summary The DASH eating plan is a healthy eating plan that has been shown to reduce high blood pressure (hypertension). It may also reduce your risk  for type 2 diabetes, heart disease, and stroke. With the DASH eating plan, you should limit salt (sodium) intake to 2,300 mg a day. If you have hypertension, you may need to reduce your sodium intake to 1,500 mg a day. When on the DASH eating plan, aim to eat more fresh fruits and vegetables, whole grains, lean proteins, low-fat dairy, and heart-healthy fats. Work with your health care provider or diet and nutrition specialist (dietitian) to adjust your eating plan to your individual calorie needs. This information is not intended to replace advice given to you by your health care provider. Make sure you discuss any questions you have with your health care provider. Document Released: 02/09/2011 Document Revised: 02/02/2017 Document Reviewed: 02/14/2016 Elsevier Patient Education  2020 [employer].

## 2021-07-27 NOTE — Assessment & Plan Note (Signed)
Unable to afford CPAP. She is working on this.

## 2021-07-27 NOTE — Assessment & Plan Note (Signed)
Blood pressures remain very elevated despite being on maximum doses of valsartan/HCTZ/amlodipine.  Recommend adding spironolactone 25 mg daily.  Check a BMP in a week she is working to get a CPAP machine.  Thyroid function has been checked and is normal.  We are going to check renal artery Dopplers to rule out renal artery stenosis.  Given that her blood pressures are elevated and she is on a combination pill, we will not hold it at this time to check for hyperaldosteronism.  She is going to really focus on lifestyle changes.  She was congratulated on increasing her exercise and encouraged to get 250 minutes weekly.  She is also going to start working on eating at home more and limiting her sodium intake.  She does consent to monitoring in our Vivify remote patient monitoring system in the remote patient monitoring study.

## 2021-08-02 ENCOUNTER — Encounter: Payer: Self-pay | Admitting: Licensed Practical Nurse

## 2021-08-04 NOTE — Research (Signed)
  Subject Name: Anna Goodman met inclusion and exclusion criteria for the Virtual Care and Social Determinant Interventions for the management of hypertension trial.  The informed consent form, study requirements and expectations were reviewed with the subject by Dr. Oval Linsey and myself. The subject was given the opportunity to read the consent and ask questions. The subject verbalized understanding of the trial requirements.  All questions were addressed prior to the signing of the consent form. The subject agreed to participate in the trial and signed the informed consent. The informed consent was obtained prior to performance of any protocol-specific procedures for the subject.  A copy of the signed informed consent was given to the subject and a copy was placed in the subject's medical record.  Laray Rivkin Profit was randomized to Group 2 on 24 - May - 2023.

## 2021-08-16 ENCOUNTER — Telehealth: Payer: Self-pay

## 2021-08-16 DIAGNOSIS — Z0000 Encounter for general adult medical examination without abnormal findings: Secondary | ICD-10-CM

## 2021-08-16 NOTE — Telephone Encounter (Signed)
Called patient for Vivify follow-up call. Patient did not answer. Mailbox is full and was not able to leave a message. Sent text through Vivify portal informing the patient that I tried to contact her for a follow-up call regarding being enrolled in the Vivify program and provided my contact information for her to return my call.   Liel Rudden Nedra Hai, [facility] Guide, Health Coach [address] [place] [address] Telephone: [phone] Email: [email or web address]

## 2021-08-17 ENCOUNTER — Telehealth: Payer: Self-pay

## 2021-08-17 DIAGNOSIS — Z0000 Encounter for general adult medical examination without abnormal findings: Secondary | ICD-10-CM

## 2021-08-17 NOTE — Telephone Encounter (Signed)
Patient returned call regarding Vivify. Patient is not having connectivity issues and the prompt times for checking her blood pressure twice daily. Patient did mentioned that she is getting two different readings that are 20-30 mm Hg in variance between her Vivify cuff and her personal Baurer cuff that is a large. Patient is wondering if there is a difference in readings due to the different size in cuffs. Offered patient health coaching based on her survey response that she is not following an eating plan. Patient mentioned that her husband cooks, but her issue is not being hungry in the mornings, and forgetting to take her lunch to work even though it's packed. Patient stated that she has to find balance in her schedule to get on track with eating better, but shared that they watch their salt intake and does not use salt when cooking. Patient also mentioned that she walks during the day for exercise. Patient has my contact information if she decides she wants to participate in health coaching. Updated Melinda on the issue patient is having with the variance in her readings.  Aalyssa Elderkin Nedra Hai, [facility] Guide, Health Coach [address] [place] [address] Telephone: [phone] Email: [email or web address]

## 2021-08-29 ENCOUNTER — Telehealth: Payer: 59

## 2021-08-29 ENCOUNTER — Telehealth: Payer: Self-pay | Admitting: Pharmacist

## 2021-08-30 ENCOUNTER — Telehealth: Payer: Self-pay

## 2021-08-30 DIAGNOSIS — Z0000 Encounter for general adult medical examination without abnormal findings: Secondary | ICD-10-CM

## 2021-09-02 ENCOUNTER — Telehealth: Payer: Self-pay

## 2021-09-02 DIAGNOSIS — Z0000 Encounter for general adult medical examination without abnormal findings: Secondary | ICD-10-CM

## 2021-09-02 NOTE — Telephone Encounter (Signed)
Called patient due to missing blood pressure readings and not responding to Vivify calls to provide technical assistance. Patient did not answer. Was not able to leave a voicemail because it is full. Sent patient another message regarding this issue via the Vivify portal.  Kieren Adkison Nedra Hai [facility] Guide, Health Coach [address] [place] [address] Telephone: [phone] Email: [email or web address]

## 2021-09-12 ENCOUNTER — Telehealth (HOSPITAL_BASED_OUTPATIENT_CLINIC_OR_DEPARTMENT_OTHER): Payer: Self-pay

## 2021-09-12 DIAGNOSIS — Z0000 Encounter for general adult medical examination without abnormal findings: Secondary | ICD-10-CM

## 2021-09-12 NOTE — Telephone Encounter (Signed)
Called patient to discuss any issues with connectivity or compliance since she did not respond to IT calls from Vivify so I could assess how to best help patient get back on track with checking her blood pressure twice daily with Vivify. Patient did not answer and was unable to leave message because voicemail is full. Sent message via Vivify portal to have patient return my call.   Donnajean Chesnut Nedra Hai, [facility] Guide, Health Coach [address] [place] [address] Telephone: [phone] Email: [email or web address]

## 2021-09-21 ENCOUNTER — Ambulatory Visit: Payer: 59 | Admitting: Family Medicine

## 2021-09-26 ENCOUNTER — Ambulatory Visit: Payer: 59

## 2021-09-28 ENCOUNTER — Telehealth: Payer: Self-pay

## 2021-09-28 DIAGNOSIS — Z0000 Encounter for general adult medical examination without abnormal findings: Secondary | ICD-10-CM

## 2021-09-28 NOTE — Telephone Encounter (Signed)
Called patient to discuss her missing Vivify tech calls due to manually entering her readings and to determine if she is continuing to experience connectivity issues due to missing bp readings since 7/21. Was unable to leave a message because voicemail is full. Sent message to patient via portal regarding these concerns, and requesting that she call me to resolve technical/compliance issues.   Kenlea Woodell Nedra Hai, [facility] Guide, Health Coach [address] [place] [address] Telephone: [phone] Email: [email or web address]

## 2021-10-04 ENCOUNTER — Telehealth: Payer: Self-pay

## 2021-10-04 ENCOUNTER — Telehealth (HOSPITAL_BASED_OUTPATIENT_CLINIC_OR_DEPARTMENT_OTHER): Payer: Self-pay | Admitting: *Deleted

## 2021-10-04 DIAGNOSIS — Z0000 Encounter for general adult medical examination without abnormal findings: Secondary | ICD-10-CM

## 2021-10-04 NOTE — Telephone Encounter (Signed)
Good afternoon Anna Goodman! I wanted to update you that we have had a hard time connecting with this patient regarding her non-compliance and manual entry of readings. I've sent in several tickets and they were not able to connect with her to troubleshoot. She was provided the information to contact them and has not. I was finally able to speak with her today and she stated that she has been too busy with going back and forth to the hospital with her husband, but is checking her bp and writing it down. However, she has not taken the time to log the readings and stated that she was going to bring her written readings to her next visit for Dr. Duke Salvia to review them.   Above message from [facility] guide   Will forward to Dr Duke Salvia for review

## 2021-10-04 NOTE — Telephone Encounter (Signed)
Called patient due to missing readings and Vivify's attempt to connect with her to troubleshoot since she has been entering her readings manually. Patient answered and informed patient that Vivify had attempted to connect with her to troubleshoot and that I had noticed she was still entering her readings manually. Patient expressed that she hasn't been able to log her readings because she has been back and forth with her hospital with her husband. Patient shared that she continues to check her bp and write it down but have not entered any. Patient stated that she was just to wait and bring in the blood pressures to have written down and have Dr. Duke Salvia review them during her next appointment. Informed patient that I was going to notate our conversation. Patient verbally expressed understanding.    Dezzie Badilla Nedra Hai, [facility] Guide, Health Coach [address], [address] [place] [address] Telephone: [phone] Email: [email or web address]

## 2021-10-11 ENCOUNTER — Ambulatory Visit (INDEPENDENT_AMBULATORY_CARE_PROVIDER_SITE_OTHER): Payer: 59 | Admitting: Pharmacist Clinician (PhC)/ Clinical Pharmacy Specialist

## 2021-10-11 ENCOUNTER — Ambulatory Visit (INDEPENDENT_AMBULATORY_CARE_PROVIDER_SITE_OTHER): Payer: 59

## 2021-10-11 ENCOUNTER — Encounter: Payer: Self-pay | Admitting: Pharmacist Clinician (PhC)/ Clinical Pharmacy Specialist

## 2021-10-11 DIAGNOSIS — I10 Essential (primary) hypertension: Secondary | ICD-10-CM

## 2021-10-11 MED ORDER — SPIRONOLACTONE 50 MG PO TABS: 50.0000 mg | ORAL_TABLET | Freq: Every day | ORAL | 3 refills | Status: DC

## 2021-10-11 MED ORDER — AMLODIPINE-VALSARTAN-HCTZ 10-320-25 MG PO TABS: 1.0000 | ORAL_TABLET | Freq: Every day | ORAL | 1 refills | Status: DC

## 2021-10-11 NOTE — Telephone Encounter (Signed)
October 11, 2021 Chilton Si, MD to [facility]  Dyanne Iha, NT  Rosalee Kaufman, RPH-CPP      10/11/21  1:11 PM Thanks.

## 2021-10-11 NOTE — Patient Instructions (Signed)
Return for a a follow up appointment Friday October 27 with Dr. Duke Salvia at 3:45  Go to the lab in 2 weeks (around August 22) to check kidney function  Check your blood pressure at home daily and keep record of the readings.  Take your BP meds as follows:  Increase spironolactone to 50 mg once daily.  Continue with all other medications  Bring all of your meds, your BP cuff and your record of home blood pressures to your next appointment.  Exercise as you're able, try to walk approximately 30 minutes per day.  Keep salt intake to a minimum, especially watch canned and prepared boxed foods.  Eat more fresh fruits and vegetables and fewer canned items.  Avoid eating in fast food restaurants.    HOW TO TAKE YOUR BLOOD PRESSURE: Rest 5 minutes before taking your blood pressure.  Don't smoke or drink caffeinated beverages for at least 30 minutes before. Take your blood pressure before (not after) you eat. Sit comfortably with your back supported and both feet on the floor (don't cross your legs). Elevate your arm to heart level on a table or a desk. Use the proper sized cuff. It should fit smoothly and snugly around your bare upper arm. There should be enough room to slip a fingertip under the cuff. The bottom edge of the cuff should be 1 inch above the crease of the elbow. Ideally, take 3 measurements at one sitting and record the average.

## 2021-10-11 NOTE — Progress Notes (Signed)
10/11/2021 Anna Goodman Jul 14, 1965 [id number]   HPI:  Anna Goodman is a 56 y.o. female patient of Dr Duke Salvia, with a PMH below who presents today for advanced hypertension clinic follow up.  Anna Goodman was referred to the [facility] by Dr. Althea Charon after an in office BP of 156/98 on maximum dose of valsartan/hctz/amlodipine.  Patient reported that her BP had been hard to treat for much of the past 10 years.  There have been some difficulties in paying for medication at times due to lack of insurance.  When she saw Dr. Duke Salvia her pressure in the office was 192/88.  Patient was agreeable to RPM in our Vivify research study.  Dr. Duke Salvia started her on spironolactone 25 mg and she was given a Vivify monitor . Renal artery dopplers showed a 1/59% stenosis of the right renal artery, with no evidence of stenosis in the left.    Today she returns for follow up. She has been checking her BP sporadically over the past few weeks, as her husband was hospitalized.  She was trying to balance both her job and his health.  She has noted that her home BP cuff always reads differently than the Vivify cuff she was given as part of the study.  She didn't know which to believe, and she was asked to just continue with the study cuff for now.  Cuff does not fit her upper arm well, although just within the size limitation, it is a poor fit to her arm shape.     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  hyperlipidemia 3/23: TG 229, LDL 149, now on atorvastatin  OSA Difficulty with cost of CPAP  obesity BMI 39.8         Blood Pressure Goal:  130/80  Current Medications: amlodipine/valsartan/hctz 10/320/25 mg qd - pm atter work, spironolactone 25 mg qd - around bedtime  Family Hx:  mother had CABG x 8 years ago; hypertension living at 52, , sister with controlled hypertension, younger sister just diagnosed; falther had liver disease; 3 children - healthy - 2 in Service  Social Hx: no tobacco, occasional  alcohol, tea with splenda  Diet: mix of home/out - out is fast food - does get chicken or salads; vegetables at home fresh/frozen; some protein, but not a whole lot; not a snacker; drinks plenty of water; sausage/gravy biscuit for breakfast - often skips to lunch  Exercise: walks 2-3 times 20 min around a work, sits at work all day  Home BP readings: no readings in past 14 days  Only 2 readings in past 30 days, both at 177/89.  Intolerances: no cardiac medications  Labs: 3/23:  Na 139, K 4.3, Glu 95, BUN 30, SCr 1.20, GFR    Wt Readings from Last 3 Encounters:  07/27/21 239 lb 3.2 oz (108.5 kg)  06/10/21 236 lb 3.2 oz (107.1 kg)  05/10/21 236 lb 6.4 oz (107.2 kg)   BP Readings from Last 3 Encounters:  10/11/21 (!) 158/86  07/27/21 (!) 192/88  06/10/21 (!) 156/98   Pulse Readings from Last 3 Encounters:  10/11/21 78  07/27/21 68  06/10/21 69    Current Outpatient Medications  Medication Sig Dispense Refill   aspirin 81 MG EC tablet Take 1 tablet by mouth daily.     atorvastatin (LIPITOR) 40 MG tablet Take 1 tablet (40 mg total) by mouth daily. 90 tablet 3   spironolactone (ALDACTONE) 50 MG tablet Take 1 tablet (50  mg total) by mouth daily. 90 tablet 3   amLODIPine-Valsartan-HCTZ 10-320-25 MG TABS Take 1 tablet by mouth daily. 90 tablet 1   No current facility-administered medications for this visit.    Allergies  Allergen Reactions   Hydrocodone-Acetaminophen     REACTION: rash    Past Medical History:  Diagnosis Date   Hypertension    Migraine     Blood pressure (!) 158/86, pulse 78.  Essential hypertension Patient with resistant hypertension, still not to goal on 4 medications.  Will have her increase the spironolactone to 50 mg daily and repeat metabolic panel in 2 weeks.  She should use the Vivify cuff on her left forearm, as that was the most accurate when tested against manual cuff, and she was encouraged to check home readings before breakfast most days.   She will follow up with Dr. Duke Salvia in October.     Phillips Hay PharmD CPP [facility] [address] [place], [place] [address] [phone]

## 2021-10-11 NOTE — Assessment & Plan Note (Signed)
Patient with resistant hypertension, still not to goal on 4 medications.  Will have her increase the spironolactone to 50 mg daily and repeat metabolic panel in 2 weeks.  She should use the Vivify cuff on her left forearm, as that was the most accurate when tested against manual cuff, and she was encouraged to check home readings before breakfast most days.  She will follow up with Dr. Duke Salvia in October.

## 2021-10-20 ENCOUNTER — Ambulatory Visit: Payer: 59 | Admitting: Family Medicine

## 2021-10-24 ENCOUNTER — Ambulatory Visit: Payer: 59 | Admitting: Pharmacist

## 2021-10-24 DIAGNOSIS — I10 Essential (primary) hypertension: Secondary | ICD-10-CM

## 2021-10-24 NOTE — Chronic Care Management (AMB) (Signed)
  Chronic Care Management   Outreach Note  10/24/2021 Name: Anna Goodman MRN: [id number] DOB: 11/01/1965  I connected with Anna Goodman on 10/24/21 by telephone outreach and verified that I am speaking with the correct person using two identifiers.  Patient appearing on report for [place] Metric Hypertension Control due to last documented ambulatory blood pressure of 158/86 on 10/11/2021. Next appointment with PCP is 8/22.   Note patient now followed by the [facility]. Last seen for office visit with [facility] PharmD on 8/8. Provider advised patient to increase her spironolactone to 50 mg daily and repeat metabolic panel in 2 weeks.  Outreached patient to discuss hypertension control and medication management.   Outpatient Encounter Medications as of 10/24/2021  Medication Sig   amLODIPine-Valsartan-HCTZ 10-320-25 MG TABS Take 1 tablet by mouth daily.   aspirin 81 MG EC tablet Take 1 tablet by mouth daily.   atorvastatin (LIPITOR) 40 MG tablet Take 1 tablet (40 mg total) by mouth daily.   spironolactone (ALDACTONE) 50 MG tablet Take 1 tablet (50 mg total) by mouth daily.   No facility-administered encounter medications on file as of 10/24/2021.    Lab Results  Component Value Date   CREATININE 1.20 (H) 06/03/2021   BUN 30 (H) 06/03/2021   NA 139 06/03/2021   K 4.3 06/03/2021   CL 102 06/03/2021   CO2 26 06/03/2021    BP Readings from Last 3 Encounters:  10/11/21 (!) 158/86  07/27/21 (!) 192/88  06/10/21 (!) 156/98    Pulse Readings from Last 3 Encounters:  10/11/21 78  07/27/21 68  06/10/21 69    Current medications: amlodipine-valsartan-HCTZ 10-320-25 mg daily; spironolactone 50 mg daily  Home Monitoring: Patient has Vivify blood pressure monitor from hypertension clinic Patient confirms monitoring using technique/placement recommended Reports last checked on 8/18, but does not recall reading.   Reports has reduced her  salt/sodium intake.  OSA/CPAP Note patient previously stated unable to pursue CPAP Titration and CPAP machine due to cost Note Care Guide reached out to patient regarding resource to help with affordability of obtaining CPAP Again encourage patient to call to follow up with Anna Goodman at [facility] regarding the CPAP machine and mask. Provide patient with contact information   Assessment/Plan: - Currently uncontrolled - Reviewed goal blood pressure <130/80 - Discussed dietary modifications, such as reduced salt intake, focus on whole grains, vegetables, lean proteins - Recommend patient follow up regarding obtaining CPAP machine - Remind patient to follow up for repeat metabolic panel (~2 weeks after start of increased spironolactone dose)   Follow Up Plan:  1) Denies need for further follow up as patient currently working with [facility] team 2) Patient to contact clinic pharmacist if needed for further medication questions or concerns  Estelle Grumbles, PharmD, P[place]y [place], CPP Clinical Pharmacist [facility] [phone]

## 2021-10-24 NOTE — Patient Instructions (Signed)
Check your blood pressure once daily, and any time you have concerning symptoms like headache, chest pain, dizziness, shortness of breath, or vision changes.   Our goal is less than 130/80.  To appropriately check your blood pressure, make sure you do the following:  1) Avoid caffeine, exercise, or tobacco products for 30 minutes before checking. Empty your bladder. 2) Sit with your back supported in a flat-backed chair. Rest your arm on something flat (arm of the chair, table, etc). 3) Sit still with your feet flat on the floor, resting, for at least 5 minutes.  4) Check your blood pressure. Take 1-2 readings.  5) Write down these readings and bring with you to any provider appointments.  Bring your home blood pressure machine with you to a provider's office for accuracy comparison at least once a year.   Make sure you take your blood pressure medications before you come to any office visit, even if you were asked to fast for labs.  Adaliah Hiegel Tanetta Fuhriman, PharmD, BCACP, CPP Clinical Pharmacist [facility] [phone]  

## 2021-10-25 ENCOUNTER — Encounter: Payer: Self-pay | Admitting: Family Medicine

## 2021-10-25 ENCOUNTER — Ambulatory Visit (INDEPENDENT_AMBULATORY_CARE_PROVIDER_SITE_OTHER): Payer: 59 | Admitting: Family Medicine

## 2021-10-25 VITALS — BP 160/78 | HR 69 | Ht 65.0 in | Wt 235.6 lb

## 2021-10-25 DIAGNOSIS — I10 Essential (primary) hypertension: Secondary | ICD-10-CM | POA: Diagnosis not present

## 2021-10-25 NOTE — Progress Notes (Signed)
Subjective:    Patient ID: Anna Goodman, female    DOB: 1965/04/15, 56 y.o.   MRN: [id number]  Anna Goodman is a 56 y.o. female presenting on 10/25/2021 for Hypertension and Sleep Apnea   HPI  CHRONIC HTN: Followed by Dr [facility] Monitoring BP through Vivify research Recent visit 10/11/21 - her Spironolactone dose was inc from 25 mg up to 50mg  daily, now today needs chemistry Current Meds - Amlodipine-Valsartan-HCTZ 10-320-25mg  daily, Spironolactone 50mg  daily   Reports good compliance, took meds today. Tolerating well, w/o complaints. - note took med 10 min after apt started. Denies CP, dyspnea, HA, edema, dizziness / lightheadedness  OSA Awaiting CPAP supplies. Insurance not covering CPAP, she has to be on her current work schedule for 6 months to get coverage starting mid July 2023.   Stress with caring for husband past month, he has liver issues and cyst and pancreatitis.       10/25/2021    9:27 AM 06/10/2021    1:46 PM 05/10/2021    3:25 PM  Depression screen PHQ 2/9  Decreased Interest 0 0 0  Down, Depressed, Hopeless 0 0 0  PHQ - 2 Score 0 0 0  Altered sleeping 0 0 0  Tired, decreased energy 1 1 0  Change in appetite 2 2 0  Feeling bad or failure about yourself  0 0 0  Trouble concentrating 0 0 0  Moving slowly or fidgety/restless 0 0 0  Suicidal thoughts 0 0 0  PHQ-9 Score 3 3 0  Difficult doing work/chores Not difficult at all Not difficult at all Not difficult at all    Social History   Tobacco Use   Smoking status: Former    Types: Cigarettes   Smokeless tobacco: Never  Substance Use Topics   Alcohol use: Yes    Comment: drinks alcohol 2-4 times per month   Drug use: No    Review of Systems Per HPI unless specifically indicated above     Objective:    BP (!) 160/78 (BP Location: Left Arm, Cuff Size: Large)   Pulse 69   Ht 5\' 5"  (1.651 m)   Wt 235 lb 9.6 oz (106.9 kg)   SpO2 100%   BMI 39.21 kg/m   Wt Readings from Last 3  Encounters:  10/25/21 235 lb 9.6 oz (106.9 kg)  07/27/21 239 lb 3.2 oz (108.5 kg)  06/10/21 236 lb 3.2 oz (107.1 kg)    Physical Exam Vitals and nursing note reviewed.  Constitutional:      General: She is not in acute distress.    Appearance: She is well-developed. She is obese. She is not diaphoretic.     Comments: Well-appearing, comfortable, cooperative  HENT:     Head: Normocephalic and atraumatic.  Eyes:     General:        Right eye: No discharge.        Left eye: No discharge.     Conjunctiva/sclera: Conjunctivae normal.  Neck:     Thyroid: No thyromegaly.  Cardiovascular:     Rate and Rhythm: Normal rate and regular rhythm.     Heart sounds: Normal heart sounds. No murmur heard. Pulmonary:     Effort: Pulmonary effort is normal. No respiratory distress.     Breath sounds: Normal breath sounds. No wheezing or rales.  Musculoskeletal:        General: Normal range of motion.     Cervical back: Normal range of motion  and neck supple.  Lymphadenopathy:     Cervical: No cervical adenopathy.  Skin:    General: Skin is warm and dry.     Findings: No erythema or rash.  Neurological:     Mental Status: She is alert and oriented to person, place, and time.  Psychiatric:        Behavior: Behavior normal.     Comments: Well groomed, good eye contact, normal speech and thoughts    Results for orders placed or performed in visit on 06/10/21  HM MAMMOGRAPHY  Result Value Ref Range   HM Mammogram 0-4 Bi-Rad 0-4 Bi-Rad, Self Reported Normal  HM PAP SMEAR  Result Value Ref Range   HM Pap smear Negative pap smear and negative high risk HPV   HM HIV SCREENING LAB  Result Value Ref Range   HM HIV Screening Negative - Validated   HM COLONOSCOPY  Result Value Ref Range   HM Colonoscopy See Report (in chart) See Report (in chart), Patient Reported      Assessment & Plan:   Problem List Items Addressed This Visit     Essential hypertension - Primary    Continues to have  resistant HTN Did not take meds prior to apt today, took meds at start of apt.  Followed [facility]  On Home BP monitoring cuff  Recently Cardiology dose inc Spironolactone from 25 to 50mg , 2 weeks ago.  Due for BMET today, lab ordered and will route chart to Dr for review.  No change to her medication regimen today. Continue current course  Discussion on upcoming plan for managing her uncontrolled OSA. She cannot get CPAP covered until she has been working for 6 months then her insurance will be activated for this coverage, anticipate Dec 2023 vs Jan 2024. She has attempted contacting DME company already given through resources by [facility] pharmacy      Relevant Orders   BASIC METABOLIC PANEL WITH GFR    No orders of the defined types were placed in this encounter.    Follow up plan: Return in about 3 months (around 01/25/2022) for 3 month HTN.   Forward chart to Dr 01/27/2022 to review the BMET and to Quay Burow [facility] CPP  [facility], DO [facility] 10/25/2021, 8:57 AM

## 2021-10-25 NOTE — Patient Instructions (Addendum)
Thank you for coming to the office today.  Good luck with upcoming plans to get the CPAP coverage within next 5 months approx, let us know if we can help.  Keep on the new higher dose Spironolactone 50mg  daily  We will check the Chemistry BMET today. I will route this to Dr for review. Feel free to contact her office via mychart or call if you have not heard back yet on the lab results.  Keep track of BP readings and continue w/ [facility] Sal[facility] through phone calls.   Please schedule a Follow-up Appointment to: Return in about 3 months (around 01/25/2022) for 3 month HTN.  If you have any other questions or concerns, please feel free to call the office or send a message through MyChart. You may also schedule an earlier appointment if necessary.  Additionally, you may be receiving a survey about your experience at our office within a few days to 1 week by e-mail or mail. We value your feedback.  01/27/2022, DO [facility], [facility]

## 2021-10-25 NOTE — Assessment & Plan Note (Signed)
Continues to have resistant HTN Did not take meds prior to apt today, took meds at start of apt.  Followed by Dr [facility]  On Home BP monitoring cuff  Recently Cardiology dose inc Spironolactone from 25 to 50mg , 2 weeks ago.  Due for BMET today, lab ordered and will route chart to Dr for review.  No change to her medication regimen today. Continue current course  Discussion on upcoming plan for managing her uncontrolled OSA. She cannot get CPAP covered until she has been working for 6 months then her insurance will be activated for this coverage, anticipate Dec 2023 vs Jan 2024. She has attempted contacting DME company already given through resources by [facility] pharmacy

## 2021-10-26 LAB — BASIC METABOLIC PANEL WITH GFR
BUN/Creatinine Ratio: 21 (calc) (ref 6–22)
BUN: 28 mg/dL — ABNORMAL HIGH (ref 7–25)
CO2: 24 mmol/L (ref 20–32)
Calcium: 10.1 mg/dL (ref 8.6–10.4)
Chloride: 103 mmol/L (ref 98–110)
Creat: 1.31 mg/dL — ABNORMAL HIGH (ref 0.50–1.03)
Glucose, Bld: 98 mg/dL (ref 65–99)
Potassium: 4.2 mmol/L (ref 3.5–5.3)
Sodium: 142 mmol/L (ref 135–146)
eGFR: 48 mL/min/{1.73_m2} — ABNORMAL LOW (ref >=60)

## 2021-10-28 ENCOUNTER — Telehealth (HOSPITAL_BASED_OUTPATIENT_CLINIC_OR_DEPARTMENT_OTHER): Payer: Self-pay

## 2021-10-28 NOTE — Telephone Encounter (Addendum)
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  Recommend repeat study in 1 year for monitoring.  Continue atorvastatin and aspirin for secondary prevention.

## 2021-11-21 ENCOUNTER — Ambulatory Visit (HOSPITAL_BASED_OUTPATIENT_CLINIC_OR_DEPARTMENT_OTHER): Payer: 59 | Admitting: Cardiovascular Disease

## 2021-12-30 ENCOUNTER — Ambulatory Visit (HOSPITAL_BASED_OUTPATIENT_CLINIC_OR_DEPARTMENT_OTHER): Payer: 59 | Admitting: Cardiovascular Disease

## 2022-01-10 ENCOUNTER — Ambulatory Visit (HOSPITAL_BASED_OUTPATIENT_CLINIC_OR_DEPARTMENT_OTHER): Payer: 59 | Admitting: Cardiovascular Disease

## 2022-01-30 ENCOUNTER — Ambulatory Visit: Payer: 59 | Admitting: Family Medicine

## 2022-04-25 ENCOUNTER — Telehealth: Payer: Self-pay

## 2022-04-25 DIAGNOSIS — Z0000 Encounter for general adult medical examination without abnormal findings: Secondary | ICD-10-CM

## 2022-04-25 NOTE — Telephone Encounter (Signed)
Called patient to advised them to return Vivify device. Unable to leave message for patient due to voicemail being full. Sent message via Vivify to try to reach patient about this matter.   Avelino Leeds, MS, ERHD, [facility]  Care Guide, Health & Wellness Coach [address], [address] [place] [address] Telephone: [phone] Email: [email or web address]

## 2022-05-08 NOTE — Progress Notes (Incomplete)
[facility] Initial Assessment:    Date:  05/08/2022   ID:  Anna Goodman, DOB October 13, 1965, MRN [id number]  PCP:  Olin Hauser, DO  Cardiologist:  None  Nephrologist:  Referring MD: Nobie Putnam *   CC: Hypertension  History of Present Illness:    Anna Goodman is a 57 y.o. female with a hx of hypertension and OSA here to establish care in the [facility].  She has struggled with hypertension for years.  She last saw her PCP 06/2021 and her blood pressure was 156/98 on max dose valsartan/HCTZ/amlodipine.  Blood pressures at home were even higher on her machine.  Therefore she was transferred to the [facility].  She does have recent diagnosis of OSA but has been unable to get a CPAP due to cost.  She notes that her blood pressure has been hard to control in the last 10 years. She was initially diagnosed she has been off and on medications at times due to lack of insurance.  Lately her blood pressure has been in the 1 70-1 90s over 80s to 110s.  She has started trying to do more walking.  She walks with her coworkers for 15 to 20 minutes/day.  She sometimes feels like her heart is racing when she walks.  She has no chest pain but does sometimes feel exhausted and gets a tingling sensation in her arms.  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  She mostly cooks at home but lately has had to eat out more because her refrigerator broke.  She now has a new refrigerator and is working to [employer] her supply.  Previous antihypertensives: Carvedilol Lisinopril  Past Medical History:  Diagnosis Date   Hypertension    Migraine     Past Surgical History:  Procedure Laterality Date   [place]    Current Medications: No outpatient medications have been marked as taking for the 05/11/22 encounter (Appointment) with Skeet Latch, MD.      Allergies:   Hydrocodone-acetaminophen   Social History   Socioeconomic History   Marital status: Married    Spouse name: Not on file   Number of children: Not on file   Years of education: Not on file   Highest education level: Not on file  Occupational History   Not on file  Tobacco Use   Smoking status: Former    Types: Cigarettes   Smokeless tobacco: Never  Substance and Sexual Activity   Alcohol use: Yes    Comment: drinks alcohol 2-4 times per month   Drug use: No   Sexual activity: Not on file  Other Topics Concern   Not on file  Social History Narrative   Not on file   Social Determinants of Health   Financial Resource Strain: Low Risk  (07/27/2021)   Overall Financial Resource Strain (CARDIA)    Difficulty of Paying Living Expenses: Not hard at all  Food Insecurity: No Food Insecurity (07/27/2021)   Hunger Vital Sign    Worried About Running Out of Food in the Last Year: Never true    [place] in the Last Year: Never true  Transportation Needs: No Transportation Needs (07/27/2021)   PRAPARE - Hydrologist (Medical): No    Lack of Transportation (Non-Medical): No  Physical Activity: Insufficiently Active (07/27/2021)   Exercise Vital Sign  Days of Exercise per Week: 4 days    Minutes of Exercise per Session: 20 min  Stress: Not on file  Social Connections: Not on file     Family History: The patient's family history includes Asthma in her brother; Cancer in her mother; Coronary artery disease in her mother; Depression in her mother; Heart attack in her sister; Hypertension in her father, mother, and sister; Sleep apnea in her mother and sister; Stroke in her mother and sister.  ROS:   Please see the history of present illness.    All other systems reviewed and are negative.  EKGs/Labs/Other Studies Reviewed:    EKG:  EKG is ordered today.  The ekg ordered today demonstrates sinus rhythm.  Rate 68 bpm.  LVH  Recent  Labs: 06/03/2021: ALT 13; Hemoglobin 13.8; Platelets 181; TSH 4.57 10/25/2021: BUN 28; Creat 1.31; Potassium 4.2; Sodium 142   Recent Lipid Panel    Component Value Date/Time   CHOL 251 (H) 06/03/2021 0840   TRIG 229 (H) 06/03/2021 0840   HDL 65 06/03/2021 0840   CHOLHDL 3.9 06/03/2021 0840   LDLCALC 149 (H) 06/03/2021 0840   LDLDIRECT 86 09/23/2008 2307    Physical Exam:   VS:  There were no vitals taken for this visit. , BMI There is no height or weight on file to calculate BMI. GENERAL:  Well appearing HEENT: Pupils equal round and reactive, fundi not visualized, oral mucosa unremarkable NECK:  No jugular venous distention, waveform within normal limits, carotid upstroke brisk and symmetric, no bruits, no thyromegaly LUNGS:  Clear to auscultation bilaterally HEART:  RRR.  PMI not displaced or sustained,S1 and S2 within normal limits, no S3, no S4, no clicks, no rubs, no murmurs ABD:  Flat, positive bowel sounds normal in frequency in pitch, no bruits, no rebound, no guarding, no midline pulsatile mass, no hepatomegaly, no splenomegaly EXT:  2 plus pulses throughout, 2+ LE edema to the ankles, no cyanosis no clubbing SKIN:  No rashes no nodules NEURO:  Cranial nerves II through XII grossly intact, motor grossly intact throughout PSYCH:  Cognitively intact, oriented to person place and time   ASSESSMENT/PLAN:    No problem-specific Assessment & Plan notes found for this encounter.    Screening for Secondary Hypertension:     07/27/2021    4:18 PM  Causes  Drugs/Herbals Screened     - Comments eats out but trying to limit it.  Limited caffeine.  Rare EtOH.  High NSAID use  Renovascular HTN Screened     - Comments Check renal artery Dopplers  Sleep Apnea Screened     - Comments She has sleep apnea and is working to get a machine.  Thyroid Disease Screened     - Comments TSH very mildly abnormal.  Hyperaldosteronism Screened     - Comments Consider checking renin/Aldo once  we are able to hold the valsartan and blood pressure is little better controlled  Pheochromocytoma N/A  Cushing's Syndrome Screened     - Comments Consider cortisol at follow-up  Hyperparathyroidism Screened     - Comments Calcium levels normal  Coarctation of the Aorta Screened     - Comments BP symmetric on repeat  Compliance Screened    Relevant Labs/Studies:    Latest Ref Rng & Units 10/25/2021    9:14 AM 06/03/2021    8:40 AM 10/08/2012   12:00 PM  Basic Labs  Sodium 135 - 146 mmol/L 142  139  140   Potassium  3.5 - 5.3 mmol/L 4.2  4.3  3.9   Creatinine 0.50 - 1.03 mg/dL 1.31  1.20  0.90        Latest Ref Rng & Units 06/03/2021    8:40 AM  Thyroid   TSH 0.40 - 4.50 mIU/L 4.57                 10/11/2021    9:56 AM  Renovascular   Renal Artery [place] Completed Yes    Disposition:    FU with MD/PharmD in 2 months    Medication Adjustments/Labs and Tests Ordered: Current medicines are reviewed at length with the patient today.  Concerns regarding medicines are outlined above.  No orders of the defined types were placed in this encounter.  No orders of the defined types were placed in this encounter.    Waynetta Pean  05/08/2022 4:42 PM    [facility]

## 2022-05-10 ENCOUNTER — Encounter (HOSPITAL_BASED_OUTPATIENT_CLINIC_OR_DEPARTMENT_OTHER): Payer: Self-pay | Admitting: *Deleted

## 2022-05-11 ENCOUNTER — Ambulatory Visit (HOSPITAL_BASED_OUTPATIENT_CLINIC_OR_DEPARTMENT_OTHER): Payer: 59 | Admitting: Cardiovascular Disease

## 2022-06-29 ENCOUNTER — Ambulatory Visit (HOSPITAL_BASED_OUTPATIENT_CLINIC_OR_DEPARTMENT_OTHER): Payer: 59 | Admitting: Cardiovascular Disease

## 2022-06-29 NOTE — Progress Notes (Deleted)
[facility] follow-up:    Date:  06/29/2022   ID:  Anna Goodman, DOB May 09, 1965, MRN [id number]  PCP:  Smitty Cords, DO  Cardiologist:  None  Nephrologist:  Referring MD: Saralyn Pilar *   CC: Hypertension  History of Present Illness:    Anna Goodman is a 57 y.o. female with a hx of hypertension and OSA here for follow-up.  She was first seen in the [facility] 07/2021.Anna Goodman  She has struggled with hypertension for years.  She last saw her PCP 06/2021 and her blood pressure was 156/98 on max dose valsartan/HCTZ/amlodipine.  Blood pressures at home were even higher on her machine.  Therefore she was transferred to the [facility].  She does have recent diagnosis of OSA but has been unable to get a CPAP due to cost.  She notes that her blood pressure has been hard to control in the last 10 years. She was initially diagnosed she has been off and on medications at times due to lack of insurance.  Lately her blood pressure has been in the 1 70-1 90s over 80s to 110s.  She has started trying to do more walking.  She walks with her coworkers for 15 to 20 minutes/day.  She sometimes feels like her heart is racing when she walks.  She has no chest pain but does sometimes feel exhausted and gets a tingling sensation in her arms.  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  She mostly cooks at home but lately has had to eat out more because her refrigerator broke.  She now has a new refrigerator and is working to [employer] her supply.  Previous antihypertensives: Carvedilol Lisinopril  Past Medical History:  Diagnosis Date   Hypertension    Migraine     Past Surgical History:  Procedure Laterality Date   CESAREAN SECTION  1988   CESAREAN SECTION  1992    Current Medications: No outpatient medications have been marked as taking for the 06/29/22 encounter (Appointment) with Chilton Si, MD.     Allergies:   Hydrocodone-acetaminophen   Social History   Socioeconomic History   Marital status: Married    Spouse name: Not on file   Number of children: Not on file   Years of education: Not on file   Highest education level: Not on file  Occupational History   Not on file  Tobacco Use   Smoking status: Former    Types: Cigarettes   Smokeless tobacco: Never  Substance and Sexual Activity   Alcohol use: Yes    Comment: drinks alcohol 2-4 times per month   Drug use: No   Sexual activity: Not on file  Other Topics Concern   Not on file  Social History Narrative   Not on file   Social Determinants of Health   Financial Resource Strain: Low Risk  (07/27/2021)   Overall Financial Resource Strain (CARDIA)    Difficulty of Paying Living Expenses: Not hard at all  Food Insecurity: No Food Insecurity (07/27/2021)   Hunger Vital Sign    Worried About Running Out of Food in the Last Year: Never true    Ran Out of Food in the Last Year: Never true  Transportation Needs: No Transportation Needs (07/27/2021)   PRAPARE - Administrator, Civil Service (Medical): No    Lack of Transportation (Non-Medical): No  Physical Activity: Insufficiently Active (07/27/2021)  Exercise Vital Sign    Days of Exercise per Week: 4 days    Minutes of Exercise per Session: 20 min  Stress: Not on file  Social Connections: Not on file     Family History: The patient's family history includes Asthma in her brother; Cancer in her mother; Coronary artery disease in her mother; Depression in her mother; Heart attack in her sister; Hypertension in her father, mother, and sister; Sleep apnea in her mother and sister; Stroke in her mother and sister.  ROS:   Please see the history of present illness.    All other systems reviewed and are negative.  EKGs/Labs/Other Studies Reviewed:    EKG:  EKG is ordered today.  The ekg ordered today demonstrates sinus rhythm.  Rate 68 bpm.   LVH  Recent Labs: 10/25/2021: BUN 28; Creat 1.31; Potassium 4.2; Sodium 142   Recent Lipid Panel    Component Value Date/Time   CHOL 251 (H) 06/03/2021 0840   TRIG 229 (H) 06/03/2021 0840   HDL 65 06/03/2021 0840   CHOLHDL 3.9 06/03/2021 0840   LDLCALC 149 (H) 06/03/2021 0840   LDLDIRECT 86 09/23/2008 2307    Physical Exam:   VS:  There were no vitals taken for this visit. , BMI There is no height or weight on file to calculate BMI. GENERAL:  Well appearing HEENT: Pupils equal round and reactive, fundi not visualized, oral mucosa unremarkable NECK:  No jugular venous distention, waveform within normal limits, carotid upstroke brisk and symmetric, no bruits, no thyromegaly LUNGS:  Clear to auscultation bilaterally HEART:  RRR.  PMI not displaced or sustained,S1 and S2 within normal limits, no S3, no S4, no clicks, no rubs, no murmurs ABD:  Flat, positive bowel sounds normal in frequency in pitch, no bruits, no rebound, no guarding, no midline pulsatile mass, no hepatomegaly, no splenomegaly EXT:  2 plus pulses throughout, 2+ LE edema to the ankles, no cyanosis no clubbing SKIN:  No rashes no nodules NEURO:  Cranial nerves II through XII grossly intact, motor grossly intact throughout PSYCH:  Cognitively intact, oriented to person place and time   ASSESSMENT/PLAN:    No problem-specific Assessment & Plan notes found for this encounter.    Screening for Secondary Hypertension:     07/27/2021    4:18 PM  Causes  Drugs/Herbals Screened     - Comments eats out but trying to limit it.  Limited caffeine.  Rare EtOH.  High NSAID use  Renovascular HTN Screened     - Comments Check renal artery Dopplers  Sleep Apnea Screened     - Comments She has sleep apnea and is working to get a machine.  Thyroid Disease Screened     - Comments TSH very mildly abnormal.  Hyperaldosteronism Screened     - Comments Consider checking renin/Aldo once we are able to hold the valsartan and blood  pressure is little better controlled  Pheochromocytoma N/A  Cushing's Syndrome Screened     - Comments Consider cortisol at follow-up  Hyperparathyroidism Screened     - Comments Calcium levels normal  Coarctation of the Aorta Screened     - Comments BP symmetric on repeat  Compliance Screened    Relevant Labs/Studies:    Latest Ref Rng & Units 10/25/2021    9:14 AM 06/03/2021    8:40 AM 10/08/2012   12:00 PM  Basic Labs  Sodium 135 - 146 mmol/L 142  139  140   Potassium 3.5 - 5.3  mmol/L 4.2  4.3  3.9   Creatinine 0.50 - 1.03 mg/dL 0.98  1.19  1.47        Latest Ref Rng & Units 06/03/2021    8:40 AM  Thyroid   TSH 0.40 - 4.50 mIU/L 4.57                 10/11/2021    9:56 AM  Renovascular   Renal Artery [place] Completed Yes    Disposition:    FU with MD/PharmD in 2 months    Medication Adjustments/Labs and Tests Ordered: Current medicines are reviewed at length with the patient today.  Concerns regarding medicines are outlined above.  No orders of the defined types were placed in this encounter.  No orders of the defined types were placed in this encounter.    Signed, Chilton Si, MD  06/29/2022 8:25 AM    [facility]

## 2022-08-30 ENCOUNTER — Ambulatory Visit (HOSPITAL_BASED_OUTPATIENT_CLINIC_OR_DEPARTMENT_OTHER): Payer: 59 | Admitting: Family

## 2022-09-20 ENCOUNTER — Encounter (HOSPITAL_BASED_OUTPATIENT_CLINIC_OR_DEPARTMENT_OTHER): Payer: Self-pay | Admitting: Cardiovascular Disease

## 2023-01-23 ENCOUNTER — Ambulatory Visit
Admission: EM | Admit: 2023-01-23 | Discharge: 2023-01-23 | Disposition: A | Discharge status: Home / Self Care | Payer: 59 | Attending: Emergency Medicine | Admitting: Emergency Medicine

## 2023-01-23 DIAGNOSIS — Z8679 Personal history of other diseases of the circulatory system: Secondary | ICD-10-CM | POA: Diagnosis not present

## 2023-01-23 DIAGNOSIS — R030 Elevated blood-pressure reading, without diagnosis of hypertension: Secondary | ICD-10-CM

## 2023-01-23 DIAGNOSIS — M545 Low back pain, unspecified: Secondary | ICD-10-CM

## 2023-01-23 MED ORDER — NAPROXEN 500 MG PO TABS: 500.0000 mg | ORAL_TABLET | Freq: Two times a day (BID) | ORAL | 0 refills | Status: AC

## 2023-01-23 MED ORDER — CYCLOBENZAPRINE HCL 10 MG PO TABS: 10.0000 mg | ORAL_TABLET | Freq: Two times a day (BID) | ORAL | 0 refills | Status: DC | PRN

## 2023-01-23 NOTE — ED Triage Notes (Signed)
Patient states that she's having lower back pain since Sat. More so on the right side. Patient states that she was cleaning house sat and thinks she over done it. No urinary sx.

## 2023-01-23 NOTE — Discharge Instructions (Addendum)
Take meds as directed. Make sure to take BP med when you get home, do not skip BP med, anti inflammatory may make BP elevated(keep an eye on this). If you develop saddle numbness, weakness, loss of bowel and bladder,etc go immediately to ER. Follow up with PCP/ortho if symptoms persist-call for appt.

## 2023-01-23 NOTE — ED Provider Notes (Signed)
[facility]    CSN: [id number] Arrival date & time: 01/23/23  1109      History   Chief Complaint Chief Complaint  Patient presents with   Back Pain    HPI DYMONIQUE GILLEN is a 57 y.o. female.   57 year old female, Merie Kloeckner, presents to urgent care evaluation of lower back pain x 3 days, right side is worse than left.  Patient states she was cleaning a house and thinks she overdid it.  Patient denies any abdominal pain or urinary symptoms. No saddle numbness, no loss of bowel and bladder.   Pt states she has hx of HTN has not taken BP med and has taken ibuprofen recently for pain, will take BP med when she gets home today.   PMH: HTN, Migraine  The history is provided by the patient. No language interpreter was used.    Past Medical History:  Diagnosis Date   Hypertension    Migraine     Patient Active Problem List   Diagnosis Date Noted   History of hypertension 01/23/2023   Elevated blood pressure reading 01/23/2023   Acute bilateral low back pain without sciatica 01/23/2023   Mixed hyperlipidemia 05/10/2021   OSA (obstructive sleep apnea) 05/10/2021   Folliculitis 10/19/2011   Morbid obesity (HCC) 09/23/2008   Essential hypertension 09/23/2008   MENORRHAGIA 09/23/2008    Past Surgical History:  Procedure Laterality Date   CESAREAN SECTION  1988   CESAREAN SECTION  1992    OB History   No obstetric history on file.      Home Medications    Prior to Admission medications   Medication Sig Start Date End Date Taking? Authorizing Provider  amLODIPine-Valsartan-HCTZ 10-320-25 MG TABS Take 1 tablet by mouth daily. 10/11/21  Yes Chilton Si, MD  aspirin 81 MG EC tablet Take 1 tablet by mouth daily. 02/21/16  Yes [provider]  atorvastatin (LIPITOR) 40 MG tablet Take 1 tablet (40 mg total) by mouth daily. 07/27/21  Yes Chilton Si, MD  cyclobenzaprine (FLEXERIL) 10 MG tablet Take 1 tablet (10 mg total) by mouth 2 (two)  times daily as needed for muscle spasms. 01/23/23  Yes Brenda Cowher, Para March, NP  naproxen (NAPROSYN) 500 MG tablet Take 1 tablet (500 mg total) by mouth 2 (two) times daily with a meal for 5 days. 01/23/23 01/28/23 Yes Keontay Vora, Para March, NP  spironolactone (ALDACTONE) 50 MG tablet Take 1 tablet (50 mg total) by mouth daily. 10/11/21  Yes Chilton Si, MD    Family History Family History  Problem Relation Age of Onset   Cancer Mother    Depression Mother    Hypertension Mother    Sleep apnea Mother    Coronary artery disease Mother    Stroke Mother    Hypertension Father    Stroke Sister    Heart attack Sister    Sleep apnea Sister    Hypertension Sister    Asthma Brother     Social History Social History   Tobacco Use   Smoking status: Former    Types: Cigarettes   Smokeless tobacco: Never  Vaping Use   Vaping status: Never Used  Substance Use Topics   Alcohol use: Yes    Comment: drinks alcohol 2-4 times per month   Drug use: No     Allergies   Hydrocodone-acetaminophen   Review of Systems Review of Systems  Gastrointestinal:  Negative for abdominal pain.  Genitourinary:  Negative for dysuria.  Musculoskeletal:  Positive for back pain and myalgias.  Skin: Negative.   All other systems reviewed and are negative.    Physical Exam Triage Vital Signs ED Triage Vitals  Encounter Vitals Group     BP 01/23/23 1122 (!) 218/86     Systolic BP Percentile --      Diastolic BP Percentile --      Pulse Rate 01/23/23 1122 72     Resp 01/23/23 1122 18     Temp 01/23/23 1122 98.3 F (36.8 C)     Temp Source 01/23/23 1122 Oral     SpO2 01/23/23 1122 100 %     Weight --      Height --      Head Circumference --      Peak Flow --      Pain Score 01/23/23 1121 9     Pain Loc --      Pain Education --      Exclude from Growth Chart --    No data found.  Updated Vital Signs BP (!) 220/108 (BP Location: Right Arm)   Pulse 72   Temp 98.3 F (36.8 C) (Oral)    Resp 18   LMP 09/13/2012   SpO2 100%   Visual Acuity Right Eye Distance:   Left Eye Distance:   Bilateral Distance:    Right Eye Near:   Left Eye Near:    Bilateral Near:     Physical Exam Vitals and nursing note reviewed.  Musculoskeletal:     Lumbar back: Spasms and tenderness present. Negative right straight leg raise test and negative left straight leg raise test.     Comments: +TTP to paraspinal muscle of low back  Neurological:     General: No focal deficit present.     Mental Status: She is alert and oriented to person, place, and time.     GCS: GCS eye subscore is 4. GCS verbal subscore is 5. GCS motor subscore is 6.     Cranial Nerves: No cranial nerve deficit.     Sensory: No sensory deficit.  Psychiatric:        Attention and Perception: Attention normal.        Mood and Affect: Mood normal.        Speech: Speech normal.      UC Treatments / Results  Labs (all labs ordered are listed, but only abnormal results are displayed) Labs Reviewed - No data to display  EKG   Radiology No results found.  Procedures Procedures (including critical care time)  Medications Ordered in UC Medications - No data to display  Initial Impression / Assessment and Plan / UC Course  I have reviewed the triage vital signs and the nursing notes.  Pertinent labs & imaging results that were available during my care of the patient were reviewed by me and considered in my medical decision making (see chart for details).    Discussed exam findings and plan of care with patient, strict go to ER precautions given.   Patient verbalized understanding to this provider.  Ddx: Low back pain, muscle spasm, HTN, elevatged blood pressure reading Final Clinical Impressions(s) / UC Diagnoses   Final diagnoses:  History of hypertension  Elevated blood pressure reading  Acute bilateral low back pain without sciatica     Discharge Instructions      Take meds as directed. Make  sure to take BP med when you get home, do not skip BP med, anti inflammatory may make  BP elevated(keep an eye on this). If you develop saddle numbness, weakness, loss of bowel and bladder,etc go immediately to ER. Follow up with PCP/ortho if symptoms persist-call for appt.     ED Prescriptions     Medication Sig Dispense Auth. Provider   cyclobenzaprine (FLEXERIL) 10 MG tablet Take 1 tablet (10 mg total) by mouth 2 (two) times daily as needed for muscle spasms. 20 tablet Kash Davie, NP   naproxen (NAPROSYN) 500 MG tablet Take 1 tablet (500 mg total) by mouth 2 (two) times daily with a meal for 5 days. 10 tablet Victory Dresden, Para March, NP      PDMP not reviewed this encounter.   Clancy Gourd, NP 01/23/23 1550

## 2024-01-18 ENCOUNTER — Encounter (HOSPITAL_COMMUNITY): Payer: Self-pay

## 2024-01-18 ENCOUNTER — Emergency Department (HOSPITAL_COMMUNITY)
Admission: EM | Admit: 2024-01-18 | Discharge: 2024-01-18 | Disposition: A | Discharge status: Home / Self Care | Attending: Emergency Medicine | Admitting: Emergency Medicine

## 2024-01-18 ENCOUNTER — Other Ambulatory Visit: Payer: Self-pay

## 2024-01-18 DIAGNOSIS — Z7982 Long term (current) use of aspirin: Secondary | ICD-10-CM | POA: Insufficient documentation

## 2024-01-18 DIAGNOSIS — Z931 Gastrostomy status: Secondary | ICD-10-CM | POA: Insufficient documentation

## 2024-01-18 NOTE — Discharge Instructions (Signed)
 Anna Goodman  Thank you for allowing us  to take care of you today.  You came to the Emergency Department today because you would like to have your G-tube removed as reports that you are able to eat by mouth.  This was placed by Dr. Marvis with gastroenterology at [facility] through the [facility] on 10/30.  Your G-tube is functioning well, there were no signs of infection.  Here in the emergency department, we are not specialists in removing G-tube's, occasionally the G-tube falls out and it is a mature G-tube (it has been in place for months years) we replaced them, however we do not manage changes such as removing them permanently.  You need to follow-up with your outpatient gastroenterologist for further guidance on how to have the G-tube safely removed and what the healing process afterwards looks like.  To-Do: 1. Please follow-up with your primary doctor within 1 - 2 weeks / as soon as possible.   Please return to the Emergency Department or call 911 if you experience have worsening of your symptoms, or do not get better, have redness, pain, swelling around your G-tube, chest pain, shortness of breath, severe or significantly worsening pain, high fever, severe confusion, pass out or have any reason to think that you need emergency medical care.   We hope you feel better soon.   Department of Emergency Medicine [facility]

## 2024-01-18 NOTE — ED Triage Notes (Signed)
 Pt BIB EMS from [facility] due to G-Tube removal. Pt has no medical complaints at this time. Pt reports Stroke on 10/14, during hospital stay pt received G-Tube. 2 weeks ago pt got cleared for G-Tube removal prompting pt to go to ED. 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 Pt stands with assistance. AAOx3, not oriented to time

## 2024-01-18 NOTE — ED Provider Notes (Signed)
 [place] EMERGENCY DEPARTMENT AT [facility] Provider Note   CSN: [id number] Arrival date & time: 01/18/24  1714     History Chief Complaint  Patient presents with   G-Tube removal    HPI: Anna Goodman is a 58 y.o. female with history pertinent recent stroke who presents complaining of wanting her G-tube removed. Patient arrived via EMS from [facility].  History provided by patient and EMS.  No interpreter required during this encounter.  Patient reports that she recently had a stroke and was given a G-tube during that hospitalization.  Reports that she has been cleared to eat by mouth, and thus would like the G-tube removed.  Denies any pain, discharge, other abnormalities from the G-tube, reports that since she is not using that she would like to have it removed.  Patient's recorded medical, surgical, social, medication list and allergies were reviewed in the Snapshot window as part of the initial history.   Prior to Admission medications   Medication Sig Start Date End Date Taking? Authorizing Provider  amLODIPine -Valsartan -HCTZ 10-320-25 MG TABS Take 1 tablet by mouth daily. 10/11/21   Raford Riggs, MD  aspirin 81 MG EC tablet Take 1 tablet by mouth daily. 02/21/16   [provider]  atorvastatin  (LIPITOR) 40 MG tablet Take 1 tablet (40 mg total) by mouth daily. 07/27/21   Raford Riggs, MD  cyclobenzaprine  (FLEXERIL ) 10 MG tablet Take 1 tablet (10 mg total) by mouth 2 (two) times daily as needed for muscle spasms. 01/23/23   Defelice, Jeanette, NP  spironolactone  (ALDACTONE ) 50 MG tablet Take 1 tablet (50 mg total) by mouth daily. 10/11/21   Raford Riggs, MD     Allergies: Hydrocodone-acetaminophen   Review of Systems   ROS as per HPI  Physical Exam Updated Vital Signs BP (!) 185/75 (BP Location: Right Arm)   Pulse 65   Temp 98.1 F (36.7 C) (Oral)   Resp 16   LMP 09/13/2012   SpO2 98%  Physical Exam Vitals and nursing note  reviewed.  Constitutional:      General: She is not in acute distress.    Appearance: She is well-developed.  HENT:     Head: Normocephalic and atraumatic.  Eyes:     Conjunctiva/sclera: Conjunctivae normal.  Cardiovascular:     Rate and Rhythm: Normal rate and regular rhythm.     Heart sounds: No murmur heard. Pulmonary:     Effort: Pulmonary effort is normal. No respiratory distress.     Breath sounds: Normal breath sounds.  Abdominal:     Palpations: Abdomen is soft.     Tenderness: There is no abdominal tenderness.     Comments: G-tube in place, C/D/I, no surrounding erythema, induration, purulence  Musculoskeletal:        General: No swelling.     Cervical back: Neck supple.  Skin:    General: Skin is warm and dry.     Capillary Refill: Capillary refill takes less than 2 seconds.  Neurological:     Mental Status: She is alert.  Psychiatric:        Mood and Affect: Mood normal.     ED Course/ Medical Decision Making/ A&P    Procedures Procedures   Medications Ordered in ED Medications - No data to display  Medical Decision Making:   Anna Goodman is a 58 y.o. female who presents for wanting her G-tube removed as per above.  Physical exam is pertinent for G-tube in place without signs  of infection.   The differential includes but is not limited to infection, blockage, displacement, dysfunction of G-tube.  Independent historian: None  External data reviewed: Notes: Reviewed discharge summary from hospitalization at [facility] for stroke, and also reviewed procedure note from 10/30 where patient had endoscopic placement of G-tube  Labs: Not indicated  Radiology: Not indicated No results found.  EKG/Medicine tests: Not indicated EKG Interpretation:                  Interventions: None  See the EMR for full details regarding lab and imaging results.  Patient overall well-appearing on exam, patient's abdomen soft, nontender, vitally stable.  No  signs of infection or dysfunction of the G-tube.  Patient would like the G-tube removed.  Discussed at length with the patient at bedside that it is understandable that she would want it removed if she is eating by mouth, however that long-term management of G-tubes, and the procedures to remove them and care for the donor site after removal is not within the scope of practice of emergency medicine, and suspect that there would be increased risks associated with removal of her G-tube given it was only placed approximately 2 weeks ago, therefore it is not appropriate for us  to remove it in the ED, discussed that she will need to follow-up with her gastroenterology team for further guidance including removal versus continuation of the G-tube.  Patient expressed understanding, referral placed back to the doctor who placed the G-tube, Dr. Marvis.  Patient discharged in stable condition back to SNF.  Presentation is most consistent with acute uncomplicated illness and Current presentation is complicated by underlying chronic conditions  Discussion of management or test interpretations with external provider(s): Not indicated  Risk Drugs:None  Disposition: DISCHARGE: I believe that the patient is safe for discharge home with outpatient follow-up. Patient was informed of all pertinent physical exam, laboratory, and imaging findings. Patient's suspected etiology of their symptom presentation was discussed with the patient and all questions were answered. We discussed following up with PCP, gastroenterology. I provided thorough ED return precautions. The patient feels safe and comfortable with this plan.  MDM generated using voice dictation software and may contain dictation errors.  Please contact me for any clarification or with any questions.  Clinical Impression:  1. Gastrointestinal tube present [facility])      Discharge   Final Clinical Impression(s) / ED Diagnoses Final diagnoses:  Gastrointestinal  tube present [facility])    Rx / DC Orders ED Discharge Orders          Ordered    Ambulatory referral to Gastroenterology        01/18/24 1734             Rogelia Jerilynn RAMAN, MD 01/19/24 1559

## 2024-02-08 ENCOUNTER — Encounter: Payer: Self-pay | Admitting: Neurology

## 2024-02-12 ENCOUNTER — Telehealth: Payer: Self-pay

## 2024-02-12 NOTE — Telephone Encounter (Signed)
 Spoke to Anna Goodman, explained that we have not seen the patient in over 2 years and they will need to contact her neurologist. Gave her neurology information. She also stated that she will contact the son with information.

## 2024-02-12 NOTE — Telephone Encounter (Signed)
 This patient has not been seen in over 2 years. Since 10/2021.   Typically home health from hospital discharge requires office visit to validate the home health orders. I cannot agree to verbal orders until this apt is either scheduled or seen by patient.   Otherwise the home health orders will be denied. Typically if patient was hospitalized, the hospital may setup the home health and sign the orders, or possibly her Neurologist can order the home health since they have an apt on 02/18/24.   I would suggest if Neurologist can authorize these orders.   02/18/2024 12:50 PM (Arrive by 12:35 PM) Skeet Juliene SAUNDERS, DO [facility]         Marsa Officer, DO [facility] 02/12/2024, 11:53 AM

## 2024-02-12 NOTE — Telephone Encounter (Signed)
 Copied from CRM #[id number]. Topic: Clinical - Medical Advice >> Feb 11, 2024  4:55 PM Vena HERO wrote: Reason for CRM: Pt son, Gwendlyn Schmitz [phone], called in today to report mothers status since having a stroke 10/14 and brain bleed in November. He states that pt was in rehab from 11/05-12/05 and [facility] health is requesting in home care and got the approval but has not heard back from pcp yet. Please follow up to advise, son is currently in [place]  as active duty hotel manager and is trying to assist as POA. I also instructed him to upload document proof of all legal rights via mychart but call disconnected as he lost service. >> Feb 11, 2024  5:03 PM Dedra B wrote: Pt son, Jerona, called back after call dropped. Informed him message had been sent.

## 2024-02-12 NOTE — Telephone Encounter (Signed)
 Copied from CRM [id number]. Topic: Clinical - Home Health Verbal Orders >> Feb 12, 2024  9:42 AM Antony RAMAN wrote: Caller/Agency: [facility] Callback Number: [phone] Service Requested: Skilled Nursing Frequency: 2x times a week for 1 week, 1x a week for 2x, and then every other week for 6 weeks Any new concerns about the patient? Yes, elevated BP 220/104 on 12/7 . Yesterday on 12/8 her BP was 179/102.  angela want to request speech therapy

## 2024-02-12 NOTE — Telephone Encounter (Signed)
 Please refer to previous message about home health.

## 2024-02-18 ENCOUNTER — Ambulatory Visit: Admitting: Neurology

## 2024-02-18 NOTE — Progress Notes (Deleted)
 NEUROLOGY CONSULTATION NOTE  Anna Goodman MRN: [id number] DOB: August 12, 1965  Referring provider: Marsa JINNY Officer, DO Primary care provider: Marsa JINNY Officer, MD  Reason for consult:  stroke  Assessment/Plan:   ***   Subjective:  ***     On 12/18/2023 found unresponsive on her desk at work.  Found to have slurred speech, facial droop and right sided weakness.  Taken to [facility].  CT head showed left basal ganglia/thalamic intraparenchymal hemorrhage.  CTA demonstrated multifocal intracranial atherosclerotic narrowing but no significant stenosis or occlusion  BP 261/99 treated  - followed by neurosurgery - repeat imaging stable and no intervention recommended.  Required G-tube.  TTE showed EF 65-70% with no significant valvular stenosis or regurgitation.    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  MRI revealed *** which appeared incidental based on location but there were microhemorrhages seen on SWI with some changes in the right midbrain which may correlate with her symptoms.  As she was 1 month out from ICH, started ASA 81mg .  Microhemorrhages thought to be secondary to uncontrolled hypertension but some were lobar, raising concern for CAA.  TTE showednegative bubble study and no significant change from prior TTE.  Lipid panel with t chol 215, TG 232, HDL 62 and LDL 118; HGB A1c 6.7   Imaging: 12/18/2023 CTA HEAD & NECK:  1.  Parenchymal hemorrhage centered in the left thalamus with small volume intraventricular extension. Given the location, this hemorrhage is favored hypertensive in etiology. There is narrowing of the third and left lateral ventricles without specific evidence of obstructive hydrocephalus at this time.  2.  Multifocal intracranial arterial stenoses as detailed above, most pronounced involving the posterior circulation. This is favored atherosclerotic in etiology and may be accentuated by  technical/artifactual reasons. Nonemergent follow-up CTA could be considered to reevaluate.  3.  No acute arterial abnormality in the neck.  12/19/2023 CT HEAD:  Similar size and appearance of an acute parenchymal hematoma centered in the left thalamus, though with slightly increased intraventricular extension of hemorrhage. Although there is no substantially progressed intracranial mass effect, the lateral ventricles have slightly enlarged, which raises concern for developing obstructive hydrocephalus. Recommend attention on short interval follow-up imaging.  12/20/2023 CT HEAD:  Mildly decreased size of the intraparenchymal hemorrhage centered in the left thalamus with a similar amount of intraventricular extension. No progressive ventricular enlargement or progressive mass effect.  12/22/2023 CT HEAD:  1.  No significant interval change in size of the left thalamic parenchymal hemorrhage with a similar amount of intraventricular extension.  2.  No progressive ventricular enlargement or progressive intracranial mass effect.  12/24/2023 CT HEAD:  1.  No significant interval change in size of the left thalamic parenchymal hemorrhage with a similar amount of intraventricular extension.  2.  No progressive ventricular enlargement or progressive intracranial mass effect.  01/21/2024 CT HEAD:  1.  No acute intracranial abnormality. No new hemorrhage identified. MRI is more sensitive for acute infarct.  2.  Expected evolution of the previously seen left thalamic hemorrhage with developing encephalomalacia and volume loss in this region.  01/21/2024 MRI BRAIN WO:  1.  Small acute right inferior temporal lobe infarct.  2.  Numerous additional scattered bilateral supratentorial tiny acute/early subacute infarcts, potentially suggesting an embolic source.  3.  Redemonstrated left thalamic hemorrhage, decreased in size compared to 12/18/2023. No new sites of intracranial hemorrhage.  4.  Extensive foci of supratentorial  and  infratentorial susceptibility artifact likely related to prior microhemorrhages and are in a distribution suggestive of hypertensive hemorrhages and potentially suggestive of early cerebral amyloid angiopathy.   PAST MEDICAL HISTORY: Past Medical History:  Diagnosis Date   Hypertension    Migraine     PAST SURGICAL HISTORY: Past Surgical History:  Procedure Laterality Date   CESAREAN SECTION  1988   CESAREAN SECTION  1992    MEDICATIONS: Medications Ordered Prior to Encounter[1]  ALLERGIES: Allergies[2]  FAMILY HISTORY: Family History  Problem Relation Age of Onset   Cancer Mother    Depression Mother    Hypertension Mother    Sleep apnea Mother    Coronary artery disease Mother    Stroke Mother    Hypertension Father    Stroke Sister    Heart attack Sister    Sleep apnea Sister    Hypertension Sister    Asthma Brother     Objective:  *** General: No acute distress.  Patient appears well-groomed.   Head:  Normocephalic/atraumatic Eyes:  fundi examined but not visualized Neck: supple, no paraspinal tenderness, full range of motion Heart: regular rate and rhythm Neurological Exam: Mental status: alert and oriented to person, place, and time, speech fluent and not dysarthric, language intact. Cranial nerves: CN I: not tested CN II: pupils equal, round and reactive to light, visual fields intact CN III, IV, VI:  full range of motion, no nystagmus, no ptosis CN V: facial sensation intact. CN VII: upper and lower face symmetric CN VIII: hearing intact CN IX, X: gag intact, uvula midline CN XI: sternocleidomastoid and trapezius muscles intact CN XII: tongue midline Bulk & Tone: normal, no fasciculations. Motor:  muscle strength 5/5 throughout Sensation:  Pinprick and vibratory sensation intact. Deep Tendon Reflexes:  2+ throughout,  toes downgoing.   Finger to nose testing:  Without dysmetria.   Gait:  Normal station and stride.  Romberg  negative.    Thank you for allowing me to take part in the care of this patient.  Juliene Dunnings, DO  CC: ***        [1]  Current Outpatient Medications on File Prior to Visit  Medication Sig Dispense Refill   amLODIPine -Valsartan -HCTZ 10-320-25 MG TABS Take 1 tablet by mouth daily. 90 tablet 1   aspirin 81 MG EC tablet Take 1 tablet by mouth daily.     atorvastatin  (LIPITOR) 40 MG tablet Take 1 tablet (40 mg total) by mouth daily. 90 tablet 3   cyclobenzaprine  (FLEXERIL ) 10 MG tablet Take 1 tablet (10 mg total) by mouth 2 (two) times daily as needed for muscle spasms. 20 tablet 0   spironolactone  (ALDACTONE ) 50 MG tablet Take 1 tablet (50 mg total) by mouth daily. 90 tablet 3   No current facility-administered medications on file prior to visit.  [2]  Allergies Allergen Reactions   Hydrocodone-Acetaminophen     REACTION: rash

## 2024-02-19 ENCOUNTER — Ambulatory Visit: Attending: Neurology

## 2024-02-19 ENCOUNTER — Encounter: Payer: Self-pay | Admitting: Neurology

## 2024-02-19 ENCOUNTER — Ambulatory Visit: Admitting: Neurology

## 2024-02-19 ENCOUNTER — Telehealth: Payer: Self-pay | Admitting: Neurology

## 2024-02-19 VITALS — BP 187/90 | HR 96 | Ht 65.0 in | Wt 216.0 lb

## 2024-02-19 DIAGNOSIS — G8111 Spastic hemiplegia affecting right dominant side: Secondary | ICD-10-CM

## 2024-02-19 DIAGNOSIS — I10 Essential (primary) hypertension: Secondary | ICD-10-CM

## 2024-02-19 DIAGNOSIS — I6310 Cerebral infarction due to embolism of unspecified precerebral artery: Secondary | ICD-10-CM

## 2024-02-19 DIAGNOSIS — I619 Nontraumatic intracerebral hemorrhage, unspecified: Secondary | ICD-10-CM

## 2024-02-19 DIAGNOSIS — I680 Cerebral amyloid angiopathy: Secondary | ICD-10-CM

## 2024-02-19 DIAGNOSIS — E854 Organ-limited amyloidosis: Secondary | ICD-10-CM

## 2024-02-19 DIAGNOSIS — E782 Mixed hyperlipidemia: Secondary | ICD-10-CM

## 2024-02-19 DIAGNOSIS — I610 Nontraumatic intracerebral hemorrhage in hemisphere, subcortical: Secondary | ICD-10-CM

## 2024-02-19 DIAGNOSIS — G4733 Obstructive sleep apnea (adult) (pediatric): Secondary | ICD-10-CM

## 2024-02-19 MED ORDER — GABAPENTIN 100 MG PO CAPS: 100.0000 mg | ORAL_CAPSULE | Freq: Three times a day (TID) | ORAL | 5 refills | Status: AC

## 2024-02-19 NOTE — Progress Notes (Unsigned)
Enrolled patient for a 14 day Zio AT monitor to be mailed to patients home  DOD to read

## 2024-02-19 NOTE — Telephone Encounter (Signed)
 Cindy_ [facility]   PH: [phone]  Cinday called in wanting to confirm if patient kept the appt for yesterday and also wanted to know if the provider would approve home health orders for PT and Nursing. Requested call back.

## 2024-02-19 NOTE — Progress Notes (Signed)
 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  Would refer to cardiology for consideration of ILR if event monitor unrevealing. As far as management with antiplatelet therapy vs anticoagulant therapy (if found to have a cardiac source for ischemic stroke):  Difficult call as taking both increases risk for intracranial hemorrhage in setting of cerebral amyloid angiopathy.  Given that she has had ischemic strokes, I would favor ASA 81mg  daily for secondary stroke prevention.  If a cardiac source is found, I would still favor remaining on ASA 81mg  daily even though anticoagulation would typically be preferred, as anticoagulation would carry a higher risk for bleeding.  If she is found to have paroxysmal atrial fibrillation, she may be a candidate for Watchman device. Other secondary stroke prevention as managed by PCP: Statin.  LDL goal less than 70 Normotensive blood pressure.  Must follow up with PCP. Hgb A1c goal less than 7 Management of OSA For right leg pain, start gabapentin  100mg  three times daily PT/OT Refer to physical medicine and rehab for consideration of Botox for right upper extremity spasticity.    Total time spent in chart and face to face with patient:  48 minutes   Subjective:    Discussed the use of AI scribe software for clinical note transcription  with the patient, who gave verbal consent to proceed.  History of Present Illness Anna Goodman is a 58 year old female with a history of stroke who presents for follow-up after recent hospitalization for  stroke.  History supplemented by hospital records and her accompanying husband.   On 12/18/2023 found unresponsive on her desk at work.  Found to have slurred speech, facial droop and right sided weakness.  Taken to [facility].  CT head showed left basal ganglia/thalamic intraparenchymal hemorrhage.  CTA demonstrated multifocal intracranial atherosclerotic narrowing but no significant stenosis or occlusion  BP 261/99 treated  - followed by neurosurgery - repeat imaging stable and no intervention recommended.  Required G-tube.  TTE showed EF 65-70% with no significant valvular stenosis or regurgitation.  She was discharged to SNF.    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  MRI revealed bilateral supratentorial ischemic infarcts which appeared incidental based on location but there were microhemorrhages seen on SWI with some changes in the right midbrain which may correlate with her symptoms.  As she was 1 month out from ICH, started ASA 81mg .  Microhemorrhages thought to be secondary to uncontrolled hypertension but some were lobar, raising concern for CAA.  TTE showed negative bubble study and no significant change from prior TTE.  Lipid panel with t chol 215, TG 232, HDL 62 and LDL 118; HGB A1c 6.7.  She was started on atorvastatin .    Currently, she is experiencing difficulty standing and pain in her right leg, starting from the upper leg and extending to the knee. She is undergoing physical therapy and takes cyclobenzaprine  for pain, although it does not provide  significant relief.  She has ongoing issues with speech. She experiences limitations in her right arm, including a limited range of motion and difficulty with certain  movements. No numbness in the right arm or leg, but limited range of motion in the right arm.  Her blood pressure has been high since discharge from the hospital, and she monitors it regularly at home, noting variability in readings depending on the cuff used. She was on medication for high blood pressure prior to her strokes and is now on baby aspirin 81 mg and atorvastatin , started during her hospital stay. She also takes metformin for diabetes.  She is currently wearing a heart monitor for two weeks.    Imaging: 12/18/2023 CTA HEAD & NECK:  1.  Parenchymal hemorrhage centered in the left thalamus with small volume intraventricular extension. Given the location, this hemorrhage is favored hypertensive in etiology. There is narrowing of the third and left lateral ventricles without specific evidence of obstructive hydrocephalus at this time.  2.  Multifocal intracranial arterial stenoses as detailed above, most pronounced involving the posterior circulation. This is favored atherosclerotic in etiology and may be accentuated by technical/artifactual reasons. Nonemergent follow-up CTA could be considered to reevaluate.  3.  No acute arterial abnormality in the neck.  12/19/2023 CT HEAD:  Similar size and appearance of an acute parenchymal hematoma centered in the left thalamus, though with slightly increased intraventricular extension of hemorrhage. Although there is no substantially progressed intracranial mass effect, the lateral ventricles have slightly enlarged, which raises concern for developing obstructive hydrocephalus. Recommend attention on short interval follow-up imaging.  12/20/2023 CT HEAD:  Mildly decreased size of the intraparenchymal hemorrhage centered in the left thalamus with a similar amount of intraventricular extension. No progressive ventricular enlargement or progressive mass effect.  12/22/2023 CT HEAD:  1.  No significant interval change in size of the left thalamic parenchymal  hemorrhage with a similar amount of intraventricular extension.  2.  No progressive ventricular enlargement or progressive intracranial mass effect.  12/24/2023 CT HEAD:  1.  No significant interval change in size of the left thalamic parenchymal hemorrhage with a similar amount of intraventricular extension.  2.  No progressive ventricular enlargement or progressive intracranial mass effect.  01/21/2024 CT HEAD:  1.  No acute intracranial abnormality. No new hemorrhage identified. MRI is more sensitive for acute infarct.  2.  Expected evolution of the previously seen left thalamic hemorrhage with developing encephalomalacia and volume loss in this region.  01/21/2024 MRI BRAIN WO:  1.  Small acute right inferior temporal lobe infarct.  2.  Numerous additional scattered bilateral supratentorial tiny acute/early subacute infarcts, potentially suggesting an embolic source.  3.  Redemonstrated left thalamic hemorrhage, decreased in size compared to 12/18/2023. No new sites of intracranial hemorrhage.  4.  Extensive foci of supratentorial and infratentorial susceptibility artifact likely related to prior microhemorrhages and are in a distribution suggestive of hypertensive hemorrhages and potentially suggestive of early cerebral amyloid angiopathy.   PAST MEDICAL HISTORY: Past Medical History:  Diagnosis Date   Hypertension    Migraine     PAST SURGICAL HISTORY: Past Surgical History:  Procedure Laterality Date   CESAREAN SECTION  1988   CESAREAN SECTION  1992    MEDICATIONS: Medications Ordered Prior to Encounter[1]  ALLERGIES: Allergies[2]  FAMILY HISTORY: Family History  Problem Relation Age of Onset   Cancer Mother    Depression Mother    Hypertension Mother    Sleep apnea Mother    Coronary  artery disease Mother    Stroke Mother    Hypertension Father    Stroke Sister    Heart attack Sister    Sleep apnea Sister    Hypertension Sister    Asthma Brother     Objective:   Blood pressure (!) 187/90, pulse 96, height 5' 5 (1.651 m), weight 216 lb (98 kg), last menstrual period 09/13/2012, SpO2 98%. General: No acute distress.  Patient appears well-groomed.   Head:  Normocephalic/atraumatic Eyes:  fundi examined but not visualized Neck: supple, no paraspinal tenderness, full range of motion Heart: regular rate and rhythm Neurological Exam: Mental status: alert and oriented to person, place, and time, speech fluent, mildly dysarthric, language intact. Cranial nerves: CN I: not tested CN II: pupils equal, round and reactive to light, visual fields intact CN III, IV, VI:  full range of motion, no nystagmus, left ptosis CN V: facial sensation intact. CN VII: right lower facial weakness CN VIII: hearing intact CN IX, X: gag intact, uvula midline CN XI: sternocleidomastoid and trapezius muscles intact CN XII: tongue midline Bulk & Tone: right upper extremity spasticity, mildly increased right proximal lower extremity spasticity Motor:  2/5 right deltoid, otherwise 3/5 right upper extremity, 4+/5 right hand.  4/5 right lower extremity; muscle strength 5/5 on left. Sensation:  Pinprick and vibratory sensation intact. Deep Tendon Reflexes:  3+ right upper and lower extremities,  toes downgoing.   Finger to nose testing:  Without dysmetria.   Gait:  Normal station and stride.  Romberg negative.    Juliene Dunnings, DO  CC: Marsa Officer, DO        [1]  Current Outpatient Medications on File Prior to Visit  Medication Sig Dispense Refill   amLODIPine -Valsartan -HCTZ 10-320-25 MG TABS Take 1 tablet by mouth daily. 90 tablet 1   aspirin 81 MG EC tablet Take 1 tablet by mouth daily.     atorvastatin  (LIPITOR) 40 MG tablet Take 1 tablet (40 mg total) by mouth daily. 90 tablet 3   cyclobenzaprine  (FLEXERIL ) 10 MG tablet Take 1 tablet (10 mg total) by mouth 2 (two) times daily as needed for muscle spasms. 20 tablet 0   spironolactone  (ALDACTONE ) 50 MG  tablet Take 1 tablet (50 mg total) by mouth daily. 90 tablet 3   No current facility-administered medications on file prior to visit.  [2]  Allergies Allergen Reactions   Hydrocodone-Acetaminophen     REACTION: rash

## 2024-02-19 NOTE — Patient Instructions (Addendum)
 After done with heart monitor, will order another 2 week heart monitor. Continue aspirin 81mg  daily, atorvastatin , losartan and diabetes medication Start gabapentin  100mg  three times daily for right leg pain Refer to physical medicine and rehab for consideration of right upper extremity Botox  Physical therapy and occupation therapy Follow up 6 months.

## 2024-02-19 NOTE — Telephone Encounter (Signed)
 Advised Anna Goodman when she called and ask the same question. Patient wasn't seen on 02/18/24 she no showed/Late. And was rescheduled to today.    Verbals are up to the provider who is seeing her to approve. If they are approved we give [facility] a call back.    Dr.Jaffe please see Last note from PCP office for orders approval.

## 2024-02-19 NOTE — Telephone Encounter (Signed)
 LMOVM for Laeann, Patient never seen. Please see message below.

## 2024-02-19 NOTE — Telephone Encounter (Signed)
 Arvin, Clinical MAnager:[facility]   PH: [phone]  LVM stating that she was calling regarding getting verbal plan of care of approval orders for home health.

## 2024-02-20 NOTE — Telephone Encounter (Signed)
 Nursing: Medication Management. Advised of Note to see PCP.   Therapy: Fall prevention and gait training,exercise  Once a week for 8 weeks.   Myles Cower that the Nursing has to be followed by the PCP as Dr. Skeet to have medications managed by her PCP.  Physical Therapy for her Falls and Gait he will okay the Once a week for 8 weeks.

## 2024-02-21 ENCOUNTER — Telehealth: Payer: Self-pay

## 2024-02-21 NOTE — Telephone Encounter (Signed)
 I called Anna Goodman [facility] [phone] to discuss the high BP  She reports that persistent high BP as stated in the previous note >180/100 without any symptoms or headache or new concerns.  The patient has seen her Neurologist 2 days ago 12/16 and had high BP still.  The last time I saw this patient was in 10/2021. 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 She is managed by the Cardiology Clinic in [place] for Resistant Hypertension.  Her next apt is 03/24/24 with the Cardiologist for the blood pressure management.  I advised that I would need to see her back in follow-up since it has been 2 years before I can manage this further.  Marsa Officer, DO [facility] 02/21/2024, 3:00 PM

## 2024-02-21 NOTE — Telephone Encounter (Signed)
 Okay to proceed w/ verbal HH orders  Marsa Officer, DO [facility] 02/21/2024, 2:30 PM

## 2024-02-21 NOTE — Telephone Encounter (Signed)
 Copied from CRM #[id number]. Topic: Clinical - Home Health Verbal Orders >> Feb 21, 2024  2:17 PM Anairis L wrote: Caller/Agency: Jori gaba [facility]  Callback Number: [phone] Secure VM  Service Requested: Occupational Therapy Frequency: Once a  week 8 week  Any new concerns about the patient? Yes  BP has been high 184/100 couple weeks ago 220/104 Today 190/100 Pulse 95  no headache no nause

## 2024-02-27 ENCOUNTER — Encounter (HOSPITAL_BASED_OUTPATIENT_CLINIC_OR_DEPARTMENT_OTHER): Admitting: Family

## 2024-03-05 ENCOUNTER — Telehealth: Payer: Self-pay

## 2024-03-05 ENCOUNTER — Encounter: Payer: Self-pay | Admitting: Family Medicine

## 2024-03-05 ENCOUNTER — Ambulatory Visit (INDEPENDENT_AMBULATORY_CARE_PROVIDER_SITE_OTHER): Admitting: Family Medicine

## 2024-03-05 VITALS — BP 158/84 | HR 99 | Ht 65.0 in | Wt 217.0 lb

## 2024-03-05 DIAGNOSIS — I1A0 Resistant hypertension: Secondary | ICD-10-CM

## 2024-03-05 DIAGNOSIS — N182 Chronic kidney disease, stage 2 (mild): Secondary | ICD-10-CM

## 2024-03-05 DIAGNOSIS — K219 Gastro-esophageal reflux disease without esophagitis: Secondary | ICD-10-CM | POA: Diagnosis not present

## 2024-03-05 DIAGNOSIS — I69151 Hemiplegia and hemiparesis following nontraumatic intracerebral hemorrhage affecting right dominant side: Secondary | ICD-10-CM | POA: Insufficient documentation

## 2024-03-05 DIAGNOSIS — E782 Mixed hyperlipidemia: Secondary | ICD-10-CM

## 2024-03-05 DIAGNOSIS — G4733 Obstructive sleep apnea (adult) (pediatric): Secondary | ICD-10-CM | POA: Diagnosis not present

## 2024-03-05 DIAGNOSIS — I6930 Unspecified sequelae of cerebral infarction: Secondary | ICD-10-CM | POA: Diagnosis not present

## 2024-03-05 DIAGNOSIS — R7303 Prediabetes: Secondary | ICD-10-CM | POA: Insufficient documentation

## 2024-03-05 DIAGNOSIS — G8929 Other chronic pain: Secondary | ICD-10-CM

## 2024-03-05 DIAGNOSIS — M545 Low back pain, unspecified: Secondary | ICD-10-CM

## 2024-03-05 DIAGNOSIS — N1832 Chronic kidney disease, stage 3b: Secondary | ICD-10-CM | POA: Insufficient documentation

## 2024-03-05 MED ORDER — ATORVASTATIN CALCIUM 40 MG PO TABS: 40.0000 mg | ORAL_TABLET | Freq: Every day | ORAL | 1 refills | Status: DC

## 2024-03-05 MED ORDER — AMLODIPINE BESYLATE 10 MG PO TABS: 10.0000 mg | ORAL_TABLET | Freq: Every day | ORAL | 1 refills | Status: DC

## 2024-03-05 MED ORDER — CYCLOBENZAPRINE HCL 10 MG PO TABS: 10.0000 mg | ORAL_TABLET | Freq: Every day | ORAL | 1 refills | Status: AC

## 2024-03-05 MED ORDER — HYDROCHLOROTHIAZIDE 25 MG PO TABS: 25.0000 mg | ORAL_TABLET | Freq: Every day | ORAL | 3 refills | Status: DC

## 2024-03-05 MED ORDER — ESOMEPRAZOLE MAGNESIUM 20 MG PO CPDR: 20.0000 mg | DELAYED_RELEASE_CAPSULE | Freq: Every day | ORAL | 1 refills | Status: AC

## 2024-03-05 MED ORDER — LOSARTAN POTASSIUM 100 MG PO TABS: 100.0000 mg | ORAL_TABLET | Freq: Every day | ORAL | 1 refills | Status: DC

## 2024-03-05 MED ORDER — HYDRALAZINE HCL 25 MG PO TABS: 25.0000 mg | ORAL_TABLET | Freq: Three times a day (TID) | ORAL | 0 refills | Status: AC | PRN

## 2024-03-05 MED ORDER — METFORMIN HCL ER 500 MG PO TB24: 500.0000 mg | ORAL_TABLET | Freq: Every day | ORAL | 1 refills | Status: AC

## 2024-03-05 NOTE — Patient Instructions (Addendum)
 Thank you for coming to the office today.  Re ordered all medications 90 days plus 1 refill   ADDED back hydrochlorothiazide  25mg  dailiy for blood pressure  Keep monitoring BP  If consistently elevated >180/100, one or both numbers high, you can take the Hydralazine 25mg  up to max of 3 times per day. It can temporarily reduce the BP and can be taken longer term if needed, but for now leave it as ONLY IF NEEDED.  Please schedule a Follow-up Appointment to: Return in about 3 months (around 06/03/2024) for 3 month Follow-up PreDM A1c, HTN, History Stroke, Labs AFTER CKD.  If you have any other questions or concerns, please feel free to call the office or send a message through MyChart. You may also schedule an earlier appointment if necessary.  Additionally, you may be receiving a survey about your experience at our office within a few days to 1 week by e-mail or mail. We value your feedback.  Marsa Officer, DO [facility], [place]

## 2024-03-05 NOTE — Telephone Encounter (Signed)
 Copied from CRM #[id number]. Topic: Appointments - Scheduling Inquiry for Clinic >> Mar 05, 2024  1:40 PM Harlene ORN wrote: Reason for CRM: Dorthea [facility] Was supposed to be seen today to have her Nursing and OT orders approved. Calling to make sure she had her appointment. Phone: [phone]

## 2024-03-05 NOTE — Progress Notes (Signed)
 "  Subjective:    Patient ID: Anna Goodman, female    DOB: 07-10-1965, 58 y.o.   MRN: [id number]  Anna Goodman is a 58 y.o. female presenting on 03/05/2024 for Hospitalization Follow-up   HPI  Discussed the use of AI scribe software for clinical note transcription with the patient, who gave verbal consent to proceed.  History of Present Illness   Anna Goodman is a 58 year old female with hypertension and a history of stroke who presents for a follow-up visit. She is accompanied by her husband.  HOSPITAL FOLLOW-UP VISIT  Hospital/Location: [facility] Date of Admission: 01/21/24 Date of Discharge: 01/23/24 Transitions of care telephone call: TOC Not completed  Reason for Admission: Stroke  FOLLOW-UP  [facility] H&P and Discharge Summary have been reviewed - Patient presents today about >30 days after recent hospitalization.  Hypertension - Persistent elevated blood pressure despite multiple antihypertensive medications, chronic history of uncontrolled BP, years past in 2023 since last visit here she has had high BP and followed by Cardiology in [facility] for advanced hypertension control. - Recent blood pressure readings approximately 180/108 mmHg - Previous use of hydrochlorothiazide  was effective for blood pressure control also was on Spironolactone  in past. 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, 2021 - Right-sided stroke affecting the inferior temporal lobe on January 20, 2022, with hospital discharge on January 22, 2022 - Persistent weakness in right arm and leg since strokes, impacting daily activities - Ongoing arm pain, managed with nightly cyclobenzaprine   PreDiabetes - A1c previously measured at 6.7% (exact date unclear) - Currently managed with metformin  Dyslipidemia and stroke prevention - On atorvastatin  for cholesterol management - Aspirin  81 mg daily for secondary stroke prevention  Gastrointestinal symptoms - Takes esomeprazole for gastric acid management   - Today reports overall has done well after discharge. Symptoms of weakness improved  - New medications on discharge: See list - Changes to current meds on discharge: see list  I have reviewed the discharge medication list, and have reconciled the current and discharge medications today.        03/05/2024    7:12 PM 10/25/2021    9:27 AM 06/10/2021    1:46 PM  Depression screen PHQ 2/9  Decreased Interest 0 0 0  Down, Depressed, Hopeless 0 0 0  PHQ - 2 Score 0 0 0  Altered sleeping  0 0  Tired, decreased energy  1 1  Change in appetite  2 2  Feeling bad or failure about yourself   0 0  Trouble concentrating  0 0  Moving slowly or fidgety/restless  0 0  Suicidal thoughts  0 0  PHQ-9 Score  3  3   Difficult doing work/chores  Not difficult at all Not difficult at all     Data saved with a previous flowsheet row definition       10/25/2021    9:28 AM 06/10/2021    1:47 PM 05/10/2021    3:26 PM  GAD 7 : Generalized Anxiety Score  Nervous, Anxious, on Edge 1 1 0  Control/stop worrying 0 0 0  Worry too much - different things 0 0 0  Trouble relaxing 1 1 0  Restless 0 0 0  Easily annoyed or irritable 0 0 0  Afraid - awful might happen 0 0 0  Total GAD 7 Score 2 2 0  Anxiety Difficulty  Not difficult at all Not difficult at all Not difficult at all    Social History[1]  Review of Systems Per HPI unless specifically indicated above     Objective:    BP (!) 158/84 (BP Location: Left Arm, Cuff Size: Normal)   Pulse 99   Ht 5' 5 (1.651 m)   Wt 217 lb (98.4 kg)   LMP 09/13/2012   SpO2 99%   BMI 36.11 kg/m   Wt Readings from Last 3 Encounters:  03/05/24 217 lb (98.4 kg)  02/19/24 216 lb (98 kg)  10/25/21 235 lb 9.6 oz (106.9 kg)    Physical Exam Vitals and nursing note reviewed.  Constitutional:      General: She is not in acute distress.     Appearance: She is well-developed. She is obese. She is not diaphoretic.     Comments: Well-appearing, comfortable, cooperative  HENT:     Head: Normocephalic and atraumatic.  Eyes:     General:        Right eye: No discharge.        Left eye: No discharge.     Conjunctiva/sclera: Conjunctivae normal.  Neck:     Thyroid: No thyromegaly.  Cardiovascular:     Rate and Rhythm: Normal rate and regular rhythm.     Heart sounds: Normal heart sounds. No murmur heard. Pulmonary:     Effort: Pulmonary effort is normal. No respiratory distress.     Breath sounds: Normal breath sounds. No wheezing or rales.  Musculoskeletal:     Cervical back: Normal range of motion and neck supple.     Comments: R upper extremity some mild reduced strength 4/5 and right lower extremity hip and knee also weakness 4/5  Using walker  Lymphadenopathy:     Cervical: No cervical adenopathy.  Skin:    General: Skin is warm and dry.     Findings: No erythema or rash.  Neurological:     Mental Status: She is alert and oriented to person, place, and time.  Psychiatric:        Behavior: Behavior normal.     Comments: Well groomed, good eye contact, normal speech and thoughts     Results for orders placed or performed in visit on 10/25/21  BASIC METABOLIC PANEL WITH GFR   Collection Time: 10/25/21  9:14 AM  Result Value Ref Range   Glucose, Bld 98 65 - 99 mg/dL   BUN 28 (H) 7 - 25 mg/dL   Creat 8.68 (H) 9.49 - 1.03 mg/dL   eGFR 48 (L) > OR = 60 mL/min/1.17m2   BUN/Creatinine Ratio 21 6 - 22 (calc)   Sodium 142 135 - 146 mmol/L   Potassium 4.2 3.5 - 5.3 mmol/L   Chloride 103 98 - 110 mmol/L   CO2 24 20 - 32 mmol/L   Calcium  10.1 8.6 - 10.4 mg/dL      Assessment & Plan:   Problem List Items Addressed This Visit     Hemiparesis of right dominant side as late effect of nontraumatic intracerebral hemorrhage (HCC)   History of cerebrovascular accident (CVA) with residual deficit - Primary   Mixed  hyperlipidemia   Relevant Medications   hydrochlorothiazide  (HYDRODIURIL ) 25 MG tablet   amLODipine  (NORVASC) 10 MG tablet   atorvastatin  (LIPITOR) 40 MG tablet   losartan (COZAAR) 100 MG tablet   hydrALAZINE (APRESOLINE) 25 MG tablet   Morbid obesity (HCC)   Relevant Medications   metFORMIN (GLUCOPHAGE-XR) 500 MG 24 hr tablet  OSA (obstructive sleep apnea)   Pre-diabetes   Relevant Medications   metFORMIN (GLUCOPHAGE-XR) 500 MG 24 hr tablet   Resistant hypertension   Relevant Medications   hydrochlorothiazide  (HYDRODIURIL ) 25 MG tablet   amLODipine  (NORVASC) 10 MG tablet   atorvastatin  (LIPITOR) 40 MG tablet   losartan (COZAAR) 100 MG tablet   hydrALAZINE (APRESOLINE) 25 MG tablet   Stage 3b chronic kidney disease (HCC)   Other Visit Diagnoses       Chronic bilateral low back pain without sciatica       Relevant Medications   cyclobenzaprine  (FLEXERIL ) 10 MG tablet     Gastroesophageal reflux disease without esophagitis       Relevant Medications   esomeprazole (NEXIUM) 20 MG capsule        Resistant hypertension Hypertension remains resistant despite multiple medications Chronic history [facility] [place] [facility] back in 2023, has been lost to follow-up in 2024 Current regimen includes amlodipine , losartan. Previously on hydrochlorothiazide , spironolactone  Complicated by hemorrhagic stroke recent history Elevated BP, repeat improved manually - Continue Amlodipine  10mg  daily, Losartan 100mg  daily - Added hydrochlorothiazide  25 mg daily. - Prescribed hydralazine 25 mg up to three times daily as needed if BP >180/100 mmHg. - Monitor blood pressure regularly. - She has upcoming OSA evaluation / CPAP starting soon that can help BP  History of cerebrovascular accident with right hemiparesis deficit residual History of at 2 recent hospitalizations with hemorrhagic strokes.  Recent cerebrovascular accident with residual  right-sided hemiparesis. Blood pressure control is crucial to prevent further events. - Continue physical rehabilitation. - Follow up with neurology and cardiology as scheduled, has upcoming cardiology apt and Neurology follow-up. Will need event monitor and further management On ASA 81mg  Statin, BP control  Obstructive sleep apnea Diagnosed with obstructive sleep apnea. CPAP therapy expected to aid in blood pressure management. - Initiate CPAP therapy on January 1st.  Prediabetes Recent A1c of 6.7%. but previously < 6.5% range - Continue to monitor blood glucose levels. Re-eval next visit determine if diabetes range.  Mixed hyperlipidemia Managed with atorvastatin  for cholesterol and stroke prevention. secondary - Continue atorvastatin  as prescribed.  Stage 2 chronic kidney disease Stage 2 chronic kidney disease. Monitoring of kidney function is necessary with new medications. - Ordered blood draw to monitor kidney function in three months.  Gastroesophageal reflux disease Managed with esomeprazole. - Continue esomeprazole as prescribed.         No orders of the defined types were placed in this encounter.   Meds ordered this encounter  Medications   hydrochlorothiazide  (HYDRODIURIL ) 25 MG tablet    Sig: Take 1 tablet (25 mg total) by mouth daily.    Dispense:  90 tablet    Refill:  3   amLODipine  (NORVASC) 10 MG tablet    Sig: Take 1 tablet (10 mg total) by mouth daily.    Dispense:  90 tablet    Refill:  1   atorvastatin  (LIPITOR) 40 MG tablet    Sig: Take 1 tablet (40 mg total) by mouth at bedtime.    Dispense:  90 tablet    Refill:  1   cyclobenzaprine  (FLEXERIL ) 10 MG tablet    Sig: Take 1 tablet (10 mg total) by mouth at bedtime.    Dispense:  90 tablet    Refill:  1   esomeprazole (NEXIUM) 20 MG capsule    Sig: Take 1 capsule (20 mg total) by mouth daily before breakfast.  Dispense:  90 capsule    Refill:  1   losartan (COZAAR) 100 MG tablet    Sig:  Take 1 tablet (100 mg total) by mouth daily.    Dispense:  90 tablet    Refill:  1   metFORMIN (GLUCOPHAGE-XR) 500 MG 24 hr tablet    Sig: Take 1 tablet (500 mg total) by mouth daily with breakfast.    Dispense:  90 tablet    Refill:  1   hydrALAZINE (APRESOLINE) 25 MG tablet    Sig: Take 1 tablet (25 mg total) by mouth 3 (three) times daily as needed (>180/100).    Dispense:  90 tablet    Refill:  0    Follow up plan: Return in about 3 months (around 06/03/2024) for 3 month Follow-up PreDM A1c, HTN, History Stroke, Labs AFTER CKD.   Marsa Officer, DO [facility] 03/05/2024, 3:06 PM     [1]  Social History Tobacco Use   Smoking status: Former    Types: Cigarettes   Smokeless tobacco: Never  Vaping Use   Vaping status: Never Used  Substance Use Topics   Alcohol use: Not Currently    Comment: drinks alcohol 2-4 times per month   Drug use: No   "

## 2024-03-05 NOTE — Telephone Encounter (Signed)
 Spoke to [place], notified her that the patient is in the office however her appointment is at 3 pm.

## 2024-03-11 ENCOUNTER — Telehealth: Payer: Self-pay

## 2024-03-11 NOTE — Telephone Encounter (Signed)
 Copied from CRM [id number]. Topic: Clinical - Home Health Verbal Orders >> Mar 10, 2024  4:52 PM Zebedee SAUNDERS wrote: Caller/Agency: [facility] per Donnajean Rushing Number: [phone] Service Requested: Skilled Nursing, OT, PT, speech therapy Frequency: OT 1w 8, N: 1w8 , pt 1w8 Any new concerns about the patient? No

## 2024-03-11 NOTE — Telephone Encounter (Signed)
 Spoke with [facility] verbal order given

## 2024-03-11 NOTE — Telephone Encounter (Signed)
 Okay to proceed w/ verbal orders  Marsa Officer, DO [facility] 03/11/2024, 12:36 PM

## 2024-03-17 ENCOUNTER — Telehealth: Payer: Self-pay

## 2024-03-17 DIAGNOSIS — Z0279 Encounter for issue of other medical certificate: Secondary | ICD-10-CM

## 2024-03-17 NOTE — Telephone Encounter (Signed)
 I have signed her paperwork. It is ready for pick up. The completed form in in my outbox.  The form says for the patient to return it on Tues 03/18/24.  Can you confirm with patient that she will come pick it up or ifs he needs it faxed or emailed somewhere on Tuesday.  Thank you!  Marsa Officer, DO [facility] 03/17/2024, 5:53 PM

## 2024-03-17 NOTE — Telephone Encounter (Signed)
 Patient called in to make sure office has received extended medical leave information via email that was sent over this morning? Patient stated that this paperwork would need to be sent back by tomorrow for her to leave to be extended. Please contact patient to advised her if paperwork has been received.

## 2024-03-17 NOTE — Telephone Encounter (Signed)
 Copied from CRM [id number]. Topic: General - Other >> Mar 17, 2024  8:52 AM Antwanette L wrote: Reason for CRM: Patient is calling to obtain the office email so the provider can complete a form to extend medical leave benefits. I contacted [employer], and they provided an email address([email or web address] ). The patient was also informed that additional fees may apply for the provider to complete the paperwork, and that any charges are determined at the provider's discretion. >> Mar 17, 2024  8:56 AM Antwanette L wrote: Patient will be sending an email to [email or web address] so the provider can complete a form to extend her medical leave. Patient was informed that a fee may apply for form completion. She stated that her employer requires the form to be completed and faxed by tomorrow

## 2024-03-18 ENCOUNTER — Telehealth: Payer: Self-pay

## 2024-03-18 NOTE — Telephone Encounter (Signed)
 Copied from CRM [id number]. Topic: General - Other >> Mar 17, 2024  8:52 AM Antwanette L wrote: Reason for CRM: Patient is calling to obtain the office email so the provider can complete a form to extend medical leave benefits. I contacted [employer], and they provided an email address([email or web address] ). The patient was also informed that additional fees may apply for the provider to complete the paperwork, and that any charges are determined at the provider's discretion. >> Mar 18, 2024  1:28 PM Rea ORN wrote: Pt called to follow up on this request because she had not heard anything back and it is due today. I advised the paperwork was completed by PCP yesterday evening and was faxed and emailed to her this morning. Pt is asking document can also be emailed to her son, [email or web address] >> Mar 17, 2024  8:56 AM Antwanette L wrote: Patient will be sending an email to [email or web address] so the provider can complete a form to extend her medical leave. Patient was informed that a fee may apply for form completion. She stated that her employer requires the form to be completed and faxed by tomorrow

## 2024-03-21 ENCOUNTER — Encounter (HOSPITAL_BASED_OUTPATIENT_CLINIC_OR_DEPARTMENT_OTHER): Payer: Self-pay

## 2024-03-24 ENCOUNTER — Encounter (HOSPITAL_BASED_OUTPATIENT_CLINIC_OR_DEPARTMENT_OTHER): Admitting: Family

## 2024-03-24 NOTE — Progress Notes (Unsigned)
 "  Advanced Hypertension Clinic Assessment:    Date:  03/24/2024   ID:  RANDELL DETTER, DOB 02-06-1966, MRN [id number]  PCP:  Edman Marsa PARAS, DO  Cardiologist:  None  Nephrologist:  Referring MD: Edman Marsa PARAS, DO   CC: Hypertension  History of Present Illness:    Anna Goodman is a 59 y.o. female with a hx of CVA, DM 2, HTN, HLD, OSA renal artery stenosis. Here to follow up in the [facility] Hypertension Clinic.   Prior secondary workup includes normal TSH.  Renal artery duplex 10/2021 with right renal artery 1-59% stenosis.  Last seen in [facility] 10/11/2021 by Kristin, PharmD.  Spironolactone  increased to 50 mg daily.  Amlodipine -valsartan -HCTZ 10 - 320 - 25 mg daily continued.  Admitted to [facility] 01/21/24 with CVA, discharged to SNF.  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  TTE with bubble with LVEF 65%, no shunt, no intracardiac mass/thrombus.  ZIO ordered to be placed at SNF.  ***needs renal duplex  Previous antihypertensives: Carvedilol Lisinopril   Secondary Causes of Hypertension  Medications/Herbal: OCP, steroids, stimulants, antidepressants, weight loss medication, immune suppressants, NSAIDs, sympathomimetics, alcohol, caffeine, licorice, ginseng, St. John's wort, chemo  Sleep Apnea Renal artery stenosis Hyperaldosteronism Hyper/hypothyroidism Pheochromocytoma: palpitations, tachycardia, headache, diaphoresis (plasma metanephrines) Cushing's syndrome: Cushingoid facies, central obesity, proximal muscle weakness, and ecchymoses, adrenal incidentaloma (cortisol) Coarctation of the aorta  Past Medical History:  Diagnosis Date   Hypertension    Migraine     Past Surgical History:  Procedure Laterality Date   CESAREAN SECTION  1988   CESAREAN SECTION  1992     Current Medications: Active Medications[1]   Allergies:   Hydrocodone-acetaminophen   Social History   Socioeconomic History   Marital status: Married    Spouse name: Not on file   Number of children: Not on file   Years of education: Not on file   Highest education level: Not on file  Occupational History   Not on file  Tobacco Use   Smoking status: Former    Types: Cigarettes   Smokeless tobacco: Never  Vaping Use   Vaping status: Never Used  Substance and Sexual Activity   Alcohol use: Not Currently    Comment: drinks alcohol 2-4 times per month   Drug use: No   Sexual activity: Not on file  Other Topics Concern   Not on file  Social History Narrative   Are you right handed or left handed? right   Are you currently employed ?    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   Who lives with you? partner   What type of home do you live in: 1 story or 2 story? one    [place]affiene none   Social Drivers of Health   Tobacco Use: Medium Risk (03/05/2024)   Patient History    Smoking Tobacco Use: Former    Smokeless Tobacco Use: Never    Passive Exposure: Not on file  Financial Resource Strain: Low Risk (07/27/2021)   Overall Financial Resource Strain (CARDIA)    Difficulty of Paying Living Expenses: Not hard at all  Food Insecurity: Low Risk (01/21/2024)   Received from [facility]   Epic    Within the past 12 months, you worried that your food would run out before you got money to  buy more: Never true    Within the past 12 months, the food you bought just didn't last and you didn't have money to get more. : Never true  Transportation Needs: No Transportation Needs (01/21/2024)   Received from [facility]    In the past 12 months, has lack of reliable transportation kept you from medical appointments, meetings, work or from getting things needed for daily living? : No  Physical Activity: Insufficiently Active (07/27/2021)    Exercise Vital Sign    Days of Exercise per Week: 4 days    Minutes of Exercise per Session: 20 min  Stress: Not on file  Social Connections: Not on file  Depression (PHQ2-9): Low Risk (03/05/2024)   Depression (PHQ2-9)    PHQ-2 Score: 0  Alcohol Screen: Low Risk (07/27/2021)   Alcohol Screen    Last Alcohol Screening Score (AUDIT): 2  Housing: Low Risk (01/21/2024)   Received from [facility]   Epic    What is your living situation today?: I have a steady place to live    Think about the place you live. Do you have problems with any of the following? Choose all that apply:: None/None on this list  Utilities: Low Risk (01/21/2024)   Received from [facility]   Utilities    In the past 12 months has the electric, gas, oil, or water company threatened to shut off services in your home? : No  Health Literacy: Not on file     Family History: The patient's ***family history includes Asthma in her brother; Cancer in her mother; Coronary artery disease in her mother; Depression in her mother; Heart attack in her sister; Hypertension in her father, mother, and sister; Sleep apnea in her mother and sister; Stroke in her mother and sister.  ROS:   Please see the history of present illness.    *** All other systems reviewed and are negative.  EKGs/Labs/Other Studies Reviewed:         Recent Labs: No results found for requested labs within last 365 days.   Recent Lipid Panel    Component Value Date/Time   CHOL 251 (H) 06/03/2021 0840   TRIG 229 (H) 06/03/2021 0840   HDL 65 06/03/2021 0840   CHOLHDL 3.9 06/03/2021 0840   LDLCALC 149 (H) 06/03/2021 0840   LDLDIRECT 86 09/23/2008 2307    Physical Exam:   VS:  LMP 09/13/2012  , BMI There is no height or weight on file to calculate BMI. GENERAL:  Well appearing HEENT: Pupils equal round and reactive, fundi not visualized, oral mucosa unremarkable NECK:  No jugular venous distention, waveform within normal limits, carotid  upstroke brisk and symmetric, no bruits, no thyromegaly LYMPHATICS:  No cervical adenopathy LUNGS:  Clear to auscultation bilaterally HEART:  RRR.  PMI not displaced or sustained,S1 and S2 within normal limits, no S3, no S4, no clicks, no rubs, *** murmurs ABD:  Flat, positive bowel sounds normal in frequency in pitch, no bruits, no rebound, no guarding, no midline pulsatile mass, no hepatomegaly, no splenomegaly EXT:  2 plus pulses throughout, no edema, no cyanosis no clubbing SKIN:  No rashes no nodules NEURO:  Cranial nerves II through XII grossly intact, motor grossly intact throughout PSYCH:  Cognitively intact, oriented to person place and time   ASSESSMENT/PLAN:    HTN -    Screening for Secondary Hypertension: { Click here to document screening for secondary causes of HTN  :[id number]}    07/27/2021  4:18 PM  Causes  Drugs/Herbals Screened     - Comments eats out but trying to limit it.  Limited caffeine.  Rare EtOH.  High NSAID use  Renovascular HTN Screened     - Comments Check renal artery Dopplers  Sleep Apnea Screened     - Comments She has sleep apnea and is working to get a machine.  Thyroid Disease Screened     - Comments TSH very mildly abnormal.  Hyperaldosteronism Screened     - Comments Consider checking renin/Aldo once we are able to hold the valsartan  and blood pressure is little better controlled  Pheochromocytoma N/A  Cushing's Syndrome Screened     - Comments Consider cortisol at follow-up  Hyperparathyroidism Screened     - Comments Calcium  levels normal  Coarctation of the Aorta Screened     - Comments BP symmetric on repeat  Compliance Screened    Relevant Labs/Studies:    Latest Ref Rng & Units 10/25/2021    9:14 AM 06/03/2021    8:40 AM 10/08/2012   12:00 PM  Basic Labs  Sodium 135 - 146 mmol/L 142  139  140   Potassium 3.5 - 5.3 mmol/L 4.2  4.3  3.9   Creatinine 0.50 - 1.03 mg/dL 8.68  8.79  9.09        Latest Ref Rng & Units 06/03/2021     8:40 AM  Thyroid   TSH 0.40 - 4.50 mIU/L 4.57                 10/11/2021    9:56 AM  Renovascular   Renal Artery US  Completed Yes     she *** interested in enrolling in the PREP exercise and nutrition program through the [facility].     Disposition:    FU with MD/APP/PharmD in {gen number 9-89:[id number]} {Days to years:10300}    Medication Adjustments/Labs and Tests Ordered: Current medicines are reviewed at length with the patient today.  Concerns regarding medicines are outlined above.  No orders of the defined types were placed in this encounter.  No orders of the defined types were placed in this encounter.    Signed, Reche GORMAN Finder, NP  03/24/2024 8:47 AM    [facility]    [1]  No outpatient medications have been marked as taking for the 03/24/24 encounter (Appointment) with Finder Reche GORMAN, NP.   "

## 2024-04-02 ENCOUNTER — Encounter: Payer: Self-pay | Admitting: Physical Medicine & Rehabilitation

## 2024-04-10 ENCOUNTER — Ambulatory Visit (HOSPITAL_BASED_OUTPATIENT_CLINIC_OR_DEPARTMENT_OTHER): Admitting: Family

## 2024-04-10 ENCOUNTER — Telehealth: Payer: Self-pay

## 2024-04-10 ENCOUNTER — Encounter (HOSPITAL_BASED_OUTPATIENT_CLINIC_OR_DEPARTMENT_OTHER): Payer: Self-pay | Admitting: Family

## 2024-04-10 VITALS — BP 140/78 | HR 86 | Ht 65.0 in | Wt 208.0 lb

## 2024-04-10 DIAGNOSIS — Z8673 Personal history of transient ischemic attack (TIA), and cerebral infarction without residual deficits: Secondary | ICD-10-CM

## 2024-04-10 DIAGNOSIS — E782 Mixed hyperlipidemia: Secondary | ICD-10-CM

## 2024-04-10 DIAGNOSIS — Z09 Encounter for follow-up examination after completed treatment for conditions other than malignant neoplasm: Secondary | ICD-10-CM

## 2024-04-10 DIAGNOSIS — I1A0 Resistant hypertension: Secondary | ICD-10-CM

## 2024-04-10 LAB — LIPID PANEL
Chol/HDL Ratio: 3.5 ratio (ref 0.0–4.4)
Cholesterol, Total: 205 mg/dL — ABNORMAL HIGH (ref 100–199)
HDL: 58 mg/dL
LDL Chol Calc (NIH): 111 mg/dL — ABNORMAL HIGH (ref 0–99)
Triglycerides: 210 mg/dL — ABNORMAL HIGH (ref 0–149)
VLDL Cholesterol Cal: 36 mg/dL (ref 5–40)

## 2024-04-10 LAB — COMPREHENSIVE METABOLIC PANEL WITH GFR
ALT: 19 [IU]/L (ref 0–32)
AST: 21 [IU]/L (ref 0–40)
Albumin: 4.4 g/dL (ref 3.8–4.9)
Alkaline Phosphatase: 40 [IU]/L — ABNORMAL LOW (ref 49–135)
BUN/Creatinine Ratio: 12 (ref 9–23)
BUN: 17 mg/dL (ref 6–24)
Bilirubin Total: 0.4 mg/dL (ref 0.0–1.2)
CO2: 24 mmol/L (ref 20–29)
Calcium: 9.7 mg/dL (ref 8.7–10.2)
Chloride: 102 mmol/L (ref 96–106)
Creatinine, Ser: 1.41 mg/dL — ABNORMAL HIGH (ref 0.57–1.00)
Globulin, Total: 2.8 g/dL (ref 1.5–4.5)
Glucose: 96 mg/dL (ref 70–99)
Potassium: 3.5 mmol/L (ref 3.5–5.2)
Sodium: 144 mmol/L (ref 134–144)
Total Protein: 7.2 g/dL (ref 6.0–8.5)
eGFR: 43 mL/min/{1.73_m2} — ABNORMAL LOW

## 2024-04-10 LAB — MAGNESIUM: Magnesium: 1.6 mg/dL (ref 1.6–2.3)

## 2024-04-10 MED ORDER — ATORVASTATIN CALCIUM 40 MG PO TABS: 40.0000 mg | ORAL_TABLET | Freq: Every day | ORAL | 3 refills | Status: AC

## 2024-04-10 MED ORDER — ASPIRIN 81 MG PO TBEC: 81.0000 mg | DELAYED_RELEASE_TABLET | Freq: Every day | ORAL | 3 refills | Status: AC

## 2024-04-10 MED ORDER — AMLODIPINE-VALSARTAN-HCTZ 10-320-25 MG PO TABS: 1.0000 | ORAL_TABLET | Freq: Every day | ORAL | 1 refills | Status: AC

## 2024-04-10 NOTE — Patient Instructions (Addendum)
 Medication Instructions:   STOP Losartan  STOP Amlodipine  STOP Hydrochlorothiazide   START Amlodipine -Valsartan -Hydrochlorothiazide  10-320-25mg  daily   Labwork: Your physician recommends that you return for lab work today: CMP, lipid panel, Lp(a), magnesium   Your physician recommends that you return for lab work in 1-2 weeks at [facility].    Testing/Procedures: Your EKG today showed an occasional early beat called a PVC. These are not dangerous and not of concern   Follow-Up: Please follow up in 2-3 months in [facility]IN[facility] with Dr. Raford, Reche Finder, NP or Allean Mink PharmD    Special Instructions:

## 2024-04-10 NOTE — Progress Notes (Unsigned)
 "  Advanced Hypertension Clinic Assessment:    Date:  04/10/2024   ID:  Anna Goodman, DOB [birth year], MRN [id number]  PCP:  Edman Marsa PARAS, DO  Cardiologist:  None  Nephrologist:  Referring MD: Edman Marsa PARAS, DO   CC: Hypertension  History of Present Illness:    Anna Goodman is a 59 y.o. female with a hx of CVA, DM 2, HTN, HLD, OSA renal artery stenosis. Here to follow up in the [facility] Hypertension Clinic.   Prior secondary workup includes normal TSH.  Renal artery duplex 10/2021 with right renal artery 1-59% stenosis.  Last seen in [facility] 10/11/2021 by Kristin, PharmD.  Spironolactone  increased to 50 mg daily.  Amlodipine -valsartan -HCTZ 10 - 320 - 25 mg daily continued.  Admitted to [facility] 01/21/24 with CVA, discharged to SNF.  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  TTE with bubble with LVEF 65%, no shunt, no intracardiac mass/thrombus.  ZIO ordered to be placed at SNF.  No results found for: LIPOA   ***needs renal duplex  Blood pressure has bene 135/160  Still working on right sided weaknes  ROM in right arm and leg At home   Previous antihypertensives: Carvedilol Lisinopril   Secondary Causes of Hypertension  Medications/Herbal: OCP, steroids, stimulants, antidepressants, weight loss medication, immune suppressants, NSAIDs, sympathomimetics, alcohol, caffeine, licorice, ginseng, St. John's wort, chemo  Sleep Apnea Renal artery stenosis Hyperaldosteronism Hyper/hypothyroidism Pheochromocytoma: palpitations, tachycardia, headache, diaphoresis (plasma metanephrines) Cushing's syndrome: Cushingoid facies, central obesity, proximal muscle weakness, and ecchymoses, adrenal incidentaloma (cortisol) Coarctation of the aorta  Past Medical History:  Diagnosis Date    Hypertension    Migraine     Past Surgical History:  Procedure Laterality Date   CESAREAN SECTION  1988   CESAREAN SECTION  1992    Current Medications: Active Medications[1]   Allergies:   Hydrocodone-acetaminophen   Social History   Socioeconomic History   Marital status: Married    Spouse name: Not on file   Number of children: Not on file   Years of education: Not on file   Highest education level: Not on file  Occupational History   Not on file  Tobacco Use   Smoking status: Former    Types: Cigarettes    Passive exposure: Past   Smokeless tobacco: Never  Vaping Use   Vaping status: Never Used  Substance and Sexual Activity   Alcohol use: Not Currently    Comment: drinks alcohol 2-4 times per month   Drug use: No   Sexual activity: Not on file  Other Topics Concern   Not on file  Social History Narrative   Are you right handed or left handed? right   Are you currently employed ?    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   Who lives with you? partner   What type of home do you live in: 1 story or 2 story? one    Caffiene none   Social Drivers of Health   Tobacco Use: Medium Risk (04/10/2024)   Patient History    Smoking Tobacco Use: Former    Smokeless Tobacco Use: Never    Passive Exposure: Past  Physicist, Medical Strain: Low Risk (07/27/2021)   Overall Financial Resource Strain (CARDIA)    Difficulty of Paying Living Expenses: Not hard at all  Food  Insecurity: Low Risk (01/21/2024)   Received from [facility]   Epic    Within the past 12 months, you worried that your food would run out before you got money to buy more: Never true    Within the past 12 months, the food you bought just didn't last and you didn't have money to get more. : Never true  Transportation Needs: No Transportation Needs (01/21/2024)   Received from [facility]    In the past 12 months, has lack of reliable transportation  kept you from medical appointments, meetings, work or from getting things needed for daily living? : No  Physical Activity: Insufficiently Active (07/27/2021)   Exercise Vital Sign    Days of Exercise per Week: 4 days    Minutes of Exercise per Session: 20 min  Stress: Not on file  Social Connections: Not on file  Depression (PHQ2-9): Low Risk (03/05/2024)   Depression (PHQ2-9)    PHQ-2 Score: 0  Alcohol Screen: Low Risk (07/27/2021)   Alcohol Screen    Last Alcohol Screening Score (AUDIT): 2  Housing: Low Risk (01/21/2024)   Received from [facility]   Epic    What is your living situation today?: I have a steady place to live    Think about the place you live. Do you have problems with any of the following? Choose all that apply:: None/None on this list  Utilities: Low Risk (01/21/2024)   Received from [facility]   Utilities    In the past 12 months has the electric, gas, oil, or water company threatened to shut off services in your home? : No  Health Literacy: Not on file     Family History: The patient's family history includes Asthma in her brother; Cancer in her mother; Coronary artery disease in her mother; Depression in her mother; Heart attack in her sister; Hypertension in her father, mother, and sister; Sleep apnea in her mother and sister; Stroke in her mother and sister.  ROS:   Please see the history of present illness.     All other systems reviewed and are negative.  EKGs/Labs/Other Studies Reviewed:         Recent Labs: No results found for requested labs within last 365 days.   Recent Lipid Panel    Component Value Date/Time   CHOL 251 (H) 06/03/2021 0840   TRIG 229 (H) 06/03/2021 0840   HDL 65 06/03/2021 0840   CHOLHDL 3.9 06/03/2021 0840   LDLCALC 149 (H) 06/03/2021 0840   LDLDIRECT 86 09/23/2008 2307    Physical Exam:   VS:  BP (!) 140/78 (BP Location: Right Arm, Patient Position: Sitting, Cuff Size: Large)   Pulse 86   Ht 5' 5 (1.651 m)    Wt 208 lb (94.3 kg)   LMP 09/13/2012   SpO2 98%   BMI 34.61 kg/m  , BMI Body mass index is 34.61 kg/m. GENERAL:  Well appearing HEENT: Pupils equal round and reactive, fundi not visualized, oral mucosa unremarkable NECK:  No jugular venous distention, waveform within normal limits, carotid upstroke brisk and symmetric, no bruits, no thyromegaly LYMPHATICS:  No cervical adenopathy LUNGS:  Clear to auscultation bilaterally HEART:  RRR.  PMI not displaced or sustained,S1 and S2 within normal limits, no S3, no S4, no clicks, no rubs, *** murmurs ABD:  Flat, positive bowel sounds normal in frequency in pitch, no bruits, no rebound, no guarding, no midline pulsatile mass, no hepatomegaly, no splenomegaly EXT:  2 plus pulses throughout, no edema, no cyanosis no clubbing SKIN:  No rashes no nodules NEURO:  Cranial nerves II through XII grossly intact, motor grossly intact throughout PSYCH:  Cognitively intact, oriented to person place and time   ASSESSMENT/PLAN:    HTN -    Screening for Secondary Hypertension: { Click here to document screening for secondary causes of HTN  :[id number]}    07/27/2021    4:18 PM  Causes  Drugs/Herbals Screened     - Comments eats out but trying to limit it.  Limited caffeine.  Rare EtOH.  High NSAID use  Renovascular HTN Screened     - Comments Check renal artery Dopplers  Sleep Apnea Screened     - Comments She has sleep apnea and is working to get a machine.  Thyroid Disease Screened     - Comments TSH very mildly abnormal.  Hyperaldosteronism Screened     - Comments Consider checking renin/Aldo once we are able to hold the valsartan  and blood pressure is little better controlled  Pheochromocytoma N/A  Cushing's Syndrome Screened     - Comments Consider cortisol at follow-up  Hyperparathyroidism Screened     - Comments Calcium  levels normal  Coarctation of the Aorta Screened     - Comments BP symmetric on repeat  Compliance Screened     Relevant Labs/Studies:    Latest Ref Rng & Units 10/25/2021    9:14 AM 06/03/2021    8:40 AM 10/08/2012   12:00 PM  Basic Labs  Sodium 135 - 146 mmol/L 142  139  140   Potassium 3.5 - 5.3 mmol/L 4.2  4.3  3.9   Creatinine 0.50 - 1.03 mg/dL 8.68  8.79  9.09        Latest Ref Rng & Units 06/03/2021    8:40 AM  Thyroid   TSH 0.40 - 4.50 mIU/L 4.57                 10/11/2021    9:56 AM  Renovascular   Renal Artery US  Completed Yes      Disposition:    FU with MD/APP/PharmD in {gen number [id number]:689602} {Days to years:10300}    Medication Adjustments/Labs and Tests Ordered: Current medicines are reviewed at length with the patient today.  Concerns regarding medicines are outlined above.  Orders Placed This Encounter  Procedures   EKG 12-Lead   No orders of the defined types were placed in this encounter.    Signed, Reche GORMAN Finder, NP  04/10/2024 4:06 PM    [facility]     [1]  Current Meds  Medication Sig   amLODipine  (NORVASC ) 10 MG tablet Take 1 tablet (10 mg total) by mouth daily.   atorvastatin  (LIPITOR) 40 MG tablet Take 1 tablet (40 mg total) by mouth at bedtime.   cyclobenzaprine  (FLEXERIL ) 10 MG tablet Take 1 tablet (10 mg total) by mouth at bedtime.   esomeprazole  (NEXIUM ) 20 MG capsule Take 1 capsule (20 mg total) by mouth daily before breakfast.   gabapentin  (NEURONTIN ) 100 MG capsule Take 1 capsule (100 mg total) by mouth 3 (three) times daily.   hydrALAZINE  (APRESOLINE ) 25 MG tablet Take 1 tablet (25 mg total) by mouth 3 (three) times daily as needed (>180/100).   hydrochlorothiazide  (HYDRODIURIL ) 25 MG tablet Take 1 tablet (25 mg total) by mouth daily.   losartan  (COZAAR ) 100 MG tablet Take 1 tablet (100 mg total) by mouth daily.   metFORMIN  (GLUCOPHAGE -XR)  500 MG 24 hr tablet Take 1 tablet (500 mg total) by mouth daily with breakfast.   "

## 2024-04-10 NOTE — Telephone Encounter (Signed)
 Copied from CRM #[id number]. Topic: Clinical - Home Health Verbal Orders >> Apr 10, 2024  9:18 AM Travis FALCON wrote: Caller/Agency: [facility]  Callback Number: [phone], Line secured  Service Requested: Occupational Therapy Frequency: 1 week 8  Any new concerns about the patient? No

## 2024-04-10 NOTE — Telephone Encounter (Signed)
 Okay to proceed w/ verbal orders  Marsa Officer, DO [facility] 04/10/2024, 10:49 AM

## 2024-04-10 NOTE — Telephone Encounter (Signed)
 Left message on secure line for Jori to proceed with verbal order

## 2024-04-11 ENCOUNTER — Telehealth: Payer: Self-pay

## 2024-04-11 NOTE — Telephone Encounter (Signed)
 Copied from CRM [id number]. Topic: Clinical - Home Health Verbal Orders >> Apr 11, 2024  1:43 PM Willma SAUNDERS wrote: Caller/Agency: [facility] Callback Number: [phone] Service Requested: Physical Therapy Frequency: 1 W 9 Any new concerns about the patient? No

## 2024-04-11 NOTE — Telephone Encounter (Signed)
 Okay to proceed with verbal orders.  Marsa Officer, DO [facility] 04/11/2024, 2:35 PM

## 2024-04-11 NOTE — Telephone Encounter (Signed)
 Verbal given.

## 2024-04-25 ENCOUNTER — Encounter: Admitting: Physical Medicine & Rehabilitation

## 2024-06-03 ENCOUNTER — Ambulatory Visit: Admitting: Family Medicine

## 2024-06-12 ENCOUNTER — Encounter (HOSPITAL_BASED_OUTPATIENT_CLINIC_OR_DEPARTMENT_OTHER): Admitting: Family

## 2024-09-04 ENCOUNTER — Ambulatory Visit: Payer: Self-pay | Admitting: Neurology
